# Patient Record
Sex: Female | Born: 1980 | Race: White | Hispanic: No | Marital: Single | Smoking: Former smoker
Health system: Southern US, Community
[De-identification: ages and names within clinical notes are randomized; demographics above are authoritative.]

## PROBLEM LIST (undated history)

## (undated) DIAGNOSIS — Z973 Presence of spectacles and contact lenses: Secondary | ICD-10-CM

## (undated) DIAGNOSIS — J45909 Unspecified asthma, uncomplicated: Secondary | ICD-10-CM

## (undated) DIAGNOSIS — M25531 Pain in right wrist: Secondary | ICD-10-CM

## (undated) DIAGNOSIS — M67431 Ganglion, right wrist: Secondary | ICD-10-CM

---

## 2001-02-08 ENCOUNTER — Encounter: Payer: Self-pay | Admitting: *Deleted

## 2001-02-08 ENCOUNTER — Inpatient Hospital Stay (HOSPITAL_COMMUNITY): Admission: AD | Admit: 2001-02-08 | Discharge: 2001-02-08 | Payer: Self-pay | Admitting: Obstetrics

## 2001-02-09 ENCOUNTER — Inpatient Hospital Stay (HOSPITAL_COMMUNITY): Admission: AD | Admit: 2001-02-09 | Discharge: 2001-02-09 | Payer: Self-pay | Admitting: Obstetrics & Gynecology

## 2002-08-25 ENCOUNTER — Emergency Department (HOSPITAL_COMMUNITY): Admission: EM | Admit: 2002-08-25 | Discharge: 2002-08-25 | Payer: Self-pay | Admitting: *Deleted

## 2003-01-03 ENCOUNTER — Inpatient Hospital Stay (HOSPITAL_COMMUNITY): Admission: AD | Admit: 2003-01-03 | Discharge: 2003-01-03 | Payer: Self-pay | Admitting: *Deleted

## 2003-01-03 ENCOUNTER — Encounter: Payer: Self-pay | Admitting: Family Medicine

## 2003-03-12 ENCOUNTER — Emergency Department (HOSPITAL_COMMUNITY): Admission: EM | Admit: 2003-03-12 | Discharge: 2003-03-12 | Payer: Self-pay | Admitting: Emergency Medicine

## 2003-06-01 ENCOUNTER — Emergency Department (HOSPITAL_COMMUNITY): Admission: EM | Admit: 2003-06-01 | Discharge: 2003-06-01 | Payer: Self-pay | Admitting: Emergency Medicine

## 2003-12-27 ENCOUNTER — Emergency Department (HOSPITAL_COMMUNITY): Admission: EM | Admit: 2003-12-27 | Discharge: 2003-12-27 | Payer: Self-pay | Admitting: Emergency Medicine

## 2004-02-24 ENCOUNTER — Emergency Department (HOSPITAL_COMMUNITY): Admission: EM | Admit: 2004-02-24 | Discharge: 2004-02-24 | Payer: Self-pay | Admitting: Family Medicine

## 2004-06-17 ENCOUNTER — Inpatient Hospital Stay (HOSPITAL_COMMUNITY): Admission: AD | Admit: 2004-06-17 | Discharge: 2004-06-18 | Payer: Self-pay | Admitting: Obstetrics and Gynecology

## 2004-08-21 ENCOUNTER — Ambulatory Visit (HOSPITAL_COMMUNITY): Admission: RE | Admit: 2004-08-21 | Discharge: 2004-08-21 | Payer: Self-pay | Admitting: *Deleted

## 2004-10-06 ENCOUNTER — Inpatient Hospital Stay (HOSPITAL_COMMUNITY): Admission: AD | Admit: 2004-10-06 | Discharge: 2004-10-06 | Payer: Self-pay | Admitting: *Deleted

## 2004-10-09 ENCOUNTER — Ambulatory Visit: Payer: Self-pay | Admitting: Family Medicine

## 2004-10-09 ENCOUNTER — Observation Stay (HOSPITAL_COMMUNITY): Admission: AD | Admit: 2004-10-09 | Discharge: 2004-10-10 | Payer: Self-pay | Admitting: *Deleted

## 2004-12-07 ENCOUNTER — Inpatient Hospital Stay (HOSPITAL_COMMUNITY): Admission: AD | Admit: 2004-12-07 | Discharge: 2004-12-07 | Payer: Self-pay | Admitting: Family Medicine

## 2004-12-23 ENCOUNTER — Inpatient Hospital Stay (HOSPITAL_COMMUNITY): Admission: AD | Admit: 2004-12-23 | Discharge: 2004-12-23 | Payer: Self-pay | Admitting: Obstetrics and Gynecology

## 2005-01-04 ENCOUNTER — Inpatient Hospital Stay (HOSPITAL_COMMUNITY): Admission: AD | Admit: 2005-01-04 | Discharge: 2005-01-04 | Payer: Self-pay | Admitting: Obstetrics & Gynecology

## 2005-01-06 ENCOUNTER — Ambulatory Visit: Payer: Self-pay | Admitting: *Deleted

## 2005-01-06 ENCOUNTER — Inpatient Hospital Stay (HOSPITAL_COMMUNITY): Admission: AD | Admit: 2005-01-06 | Discharge: 2005-01-10 | Payer: Self-pay | Admitting: Obstetrics and Gynecology

## 2005-01-07 ENCOUNTER — Encounter (INDEPENDENT_AMBULATORY_CARE_PROVIDER_SITE_OTHER): Payer: Self-pay | Admitting: *Deleted

## 2006-03-19 ENCOUNTER — Emergency Department (HOSPITAL_COMMUNITY): Admission: EM | Admit: 2006-03-19 | Discharge: 2006-03-19 | Payer: Self-pay | Admitting: Emergency Medicine

## 2006-06-22 ENCOUNTER — Emergency Department (HOSPITAL_COMMUNITY): Admission: EM | Admit: 2006-06-22 | Discharge: 2006-06-22 | Payer: Self-pay | Admitting: Emergency Medicine

## 2006-11-25 ENCOUNTER — Ambulatory Visit: Payer: Self-pay | Admitting: Obstetrics & Gynecology

## 2006-11-25 ENCOUNTER — Inpatient Hospital Stay (HOSPITAL_COMMUNITY): Admission: AD | Admit: 2006-11-25 | Discharge: 2006-11-25 | Payer: Self-pay | Admitting: Obstetrics & Gynecology

## 2006-11-25 ENCOUNTER — Emergency Department (HOSPITAL_COMMUNITY): Admission: EM | Admit: 2006-11-25 | Discharge: 2006-11-25 | Payer: Self-pay | Admitting: Family Medicine

## 2006-11-25 ENCOUNTER — Ambulatory Visit (HOSPITAL_COMMUNITY): Admission: RE | Admit: 2006-11-25 | Discharge: 2006-11-25 | Payer: Self-pay | Admitting: Family Medicine

## 2006-11-27 ENCOUNTER — Inpatient Hospital Stay (HOSPITAL_COMMUNITY): Admission: AD | Admit: 2006-11-27 | Discharge: 2006-11-27 | Payer: Self-pay | Admitting: Gynecology

## 2006-12-09 ENCOUNTER — Ambulatory Visit: Payer: Self-pay | Admitting: Obstetrics & Gynecology

## 2006-12-23 ENCOUNTER — Ambulatory Visit: Payer: Self-pay | Admitting: *Deleted

## 2007-03-10 ENCOUNTER — Emergency Department (HOSPITAL_COMMUNITY): Admission: EM | Admit: 2007-03-10 | Discharge: 2007-03-10 | Payer: Self-pay | Admitting: Emergency Medicine

## 2008-02-05 ENCOUNTER — Emergency Department (HOSPITAL_COMMUNITY): Admission: EM | Admit: 2008-02-05 | Discharge: 2008-02-05 | Payer: Self-pay | Admitting: Emergency Medicine

## 2008-03-12 ENCOUNTER — Emergency Department (HOSPITAL_COMMUNITY): Admission: EM | Admit: 2008-03-12 | Discharge: 2008-03-12 | Payer: Self-pay | Admitting: Family Medicine

## 2008-05-16 ENCOUNTER — Emergency Department (HOSPITAL_COMMUNITY): Admission: EM | Admit: 2008-05-16 | Discharge: 2008-05-16 | Payer: Self-pay | Admitting: Emergency Medicine

## 2008-11-14 ENCOUNTER — Ambulatory Visit: Payer: Self-pay | Admitting: Family Medicine

## 2008-11-14 ENCOUNTER — Encounter (INDEPENDENT_AMBULATORY_CARE_PROVIDER_SITE_OTHER): Payer: Self-pay | Admitting: Family Medicine

## 2008-12-27 ENCOUNTER — Emergency Department (HOSPITAL_COMMUNITY): Admission: EM | Admit: 2008-12-27 | Discharge: 2008-12-27 | Payer: Self-pay | Admitting: Emergency Medicine

## 2009-03-20 ENCOUNTER — Ambulatory Visit: Payer: Self-pay | Admitting: Obstetrics and Gynecology

## 2009-03-20 LAB — CONVERTED CEMR LAB: Yeast Wet Prep HPF POC: NONE SEEN

## 2009-03-26 ENCOUNTER — Ambulatory Visit: Payer: Self-pay | Admitting: Advanced Practice Midwife

## 2009-03-26 ENCOUNTER — Inpatient Hospital Stay (HOSPITAL_COMMUNITY): Admission: AD | Admit: 2009-03-26 | Discharge: 2009-03-26 | Payer: Self-pay | Admitting: Obstetrics & Gynecology

## 2009-05-28 ENCOUNTER — Ambulatory Visit (HOSPITAL_COMMUNITY): Admission: RE | Admit: 2009-05-28 | Discharge: 2009-05-28 | Payer: Self-pay | Admitting: Obstetrics

## 2009-08-31 ENCOUNTER — Inpatient Hospital Stay (HOSPITAL_COMMUNITY): Admission: AD | Admit: 2009-08-31 | Discharge: 2009-09-01 | Payer: Self-pay | Admitting: Obstetrics

## 2009-10-08 ENCOUNTER — Inpatient Hospital Stay (HOSPITAL_COMMUNITY): Admission: RE | Admit: 2009-10-08 | Discharge: 2009-10-10 | Payer: Self-pay | Admitting: Obstetrics

## 2011-01-18 LAB — CBC
Hemoglobin: 10.3 g/dL — ABNORMAL LOW (ref 12.0–15.0)
Hemoglobin: 7.3 g/dL — ABNORMAL LOW (ref 12.0–15.0)
RBC: 2.6 MIL/uL — ABNORMAL LOW (ref 3.87–5.11)
RBC: 3.67 MIL/uL — ABNORMAL LOW (ref 3.87–5.11)
RDW: 14.3 % (ref 11.5–15.5)
RDW: 14.6 % (ref 11.5–15.5)
WBC: 8.1 10*3/uL (ref 4.0–10.5)

## 2011-01-20 LAB — COMPREHENSIVE METABOLIC PANEL
ALT: 18 U/L (ref 0–35)
AST: 20 U/L (ref 0–37)
Alkaline Phosphatase: 133 U/L — ABNORMAL HIGH (ref 39–117)
CO2: 23 mEq/L (ref 19–32)
Calcium: 8.4 mg/dL (ref 8.4–10.5)
Chloride: 107 mEq/L (ref 96–112)
GFR calc non Af Amer: >60 mL/min (ref >60)
Glucose, Bld: 94 mg/dL (ref 70–99)
Potassium: 3.4 mEq/L — ABNORMAL LOW (ref 3.5–5.1)
Sodium: 136 mEq/L (ref 135–145)

## 2011-01-20 LAB — CBC
HCT: 30.2 % — ABNORMAL LOW (ref 36.0–46.0)
Hemoglobin: 10 g/dL — ABNORMAL LOW (ref 12.0–15.0)
MCHC: 33 g/dL (ref 30.0–36.0)
MCV: 85.1 fL (ref 78.0–100.0)
RBC: 3.55 MIL/uL — ABNORMAL LOW (ref 3.87–5.11)
WBC: 10.3 10*3/uL (ref 4.0–10.5)

## 2011-01-25 LAB — URINALYSIS, ROUTINE W REFLEX MICROSCOPIC
Bilirubin Urine: NEGATIVE
Glucose, UA: NEGATIVE mg/dL
Hgb urine dipstick: NEGATIVE
Ketones, ur: NEGATIVE mg/dL

## 2011-01-25 LAB — WET PREP, GENITAL

## 2011-01-25 LAB — POCT PREGNANCY, URINE: Preg Test, Ur: POSITIVE

## 2011-01-25 LAB — GC/CHLAMYDIA PROBE AMP, GENITAL: GC Probe Amp, Genital: NEGATIVE

## 2011-01-28 LAB — COMPREHENSIVE METABOLIC PANEL
BUN: 21 mg/dL (ref 6–23)
CO2: 24 mEq/L (ref 19–32)
Chloride: 107 mEq/L (ref 96–112)
Creatinine, Ser: 0.7 mg/dL (ref 0.4–1.2)
GFR calc non Af Amer: >60 mL/min (ref >60)
Glucose, Bld: 132 mg/dL — ABNORMAL HIGH (ref 70–99)
Total Bilirubin: 0.8 mg/dL (ref 0.3–1.2)

## 2011-01-28 LAB — URINALYSIS, ROUTINE W REFLEX MICROSCOPIC
Bilirubin Urine: NEGATIVE
Ketones, ur: 15 mg/dL — AB
Nitrite: NEGATIVE
Protein, ur: NEGATIVE mg/dL
Specific Gravity, Urine: 1.027 (ref 1.005–1.030)
Urobilinogen, UA: 0.2 mg/dL (ref 0.0–1.0)

## 2011-01-28 LAB — GC/CHLAMYDIA PROBE AMP, GENITAL
Chlamydia, DNA Probe: NEGATIVE
GC Probe Amp, Genital: NEGATIVE

## 2011-01-28 LAB — CBC
HCT: 43.4 % (ref 36.0–46.0)
Hemoglobin: 14.5 g/dL (ref 12.0–15.0)
Platelets: 259 10*3/uL (ref 150–400)
RBC: 5.16 MIL/uL — ABNORMAL HIGH (ref 3.87–5.11)
RDW: 13.6 % (ref 11.5–15.5)

## 2011-01-28 LAB — WET PREP, GENITAL
Trich, Wet Prep: NONE SEEN
Yeast Wet Prep HPF POC: NONE SEEN

## 2011-01-28 LAB — DIFFERENTIAL
Basophils Relative: 0 % (ref 0–1)
Monocytes Relative: 4 % (ref 3–12)
Neutro Abs: 10.3 10*3/uL — ABNORMAL HIGH (ref 1.7–7.7)
Neutrophils Relative %: 94 % — ABNORMAL HIGH (ref 43–77)

## 2011-01-28 LAB — PREGNANCY, URINE: Preg Test, Ur: NEGATIVE

## 2011-01-28 LAB — URINE CULTURE

## 2011-01-28 LAB — LIPASE, BLOOD: Lipase: 15 U/L (ref 11–59)

## 2011-03-05 NOTE — Discharge Summary (Signed)
[name], Alice Wagner            ACCOUNT NO.:  [id number]   MEDICAL RECORD NO.:  [id number]          PATIENT TYPE:  INP   LOCATION:  9155                           FACILITY:   PHYSICIAN:  Conni Elliot, M.D.DATE OF BIRTH:  1980-10-23   DATE OF ADMISSION:  10/09/2004  DATE OF DISCHARGE:  10/10/2004                                 DISCHARGE SUMMARY   ADMISSION DIAGNOSES:  96.  A 30 year old, gravida 1 at 28-2/7 weeks with vomiting and diarrhea.  2.  Positive ketonuria.   DISCHARGE DIAGNOSES:  15.  A 30 year old, gravid 1 at 28-3/7 weeks with viral gastroenteritis and      resolved diarrhea and vomiting.  2.  Reassuring fetal heart tracing.   ADMISSION HISTORY:  [name] was admitted with suspected viral  gastroenteritis.  She did have ketones in her urine and a specific gravity  greater than 1.030.  She was admitted for IV fluid hydration.   HOSPITAL COURSE:  On hospital day #2, the patient had had no further  vomiting or diarrhea.  She was keeping down light meals.   CONDITION ON DISCHARGE:  The patient was discharged to home in stable  condition.   INSTRUCTIONS GIVEN TO THE PATIENT:  The patient was told to continue  prenatal vitamins and follow up with [facility] at her next scheduled  appointment.      LC/MEDQ  D:  10/10/2004  T:  10/11/2004  Job:  [id number]   cc:   [facility]

## 2011-03-05 NOTE — Op Note (Signed)
Alice Wagner, Alice Wagner            ACCOUNT NO.:  [id number]   MEDICAL RECORD NO.:  [id number]          PATIENT TYPE:  INP   LOCATION:  9105                          FACILITY:  [facility]   PHYSICIAN:  Conni Elliot, M.D.DATE OF BIRTH:  24-Sep-1981   DATE OF PROCEDURE:  01/07/2005  DATE OF DISCHARGE:  01/10/2005                                 OPERATIVE REPORT   PREOPERATIVE DIAGNOSES:  1.  Arrest of descent.  2.  Cephalopelvic disproportion.   POSTOPERATIVE DIAGNOSES:  1.  Arrest of descent.  2.  Cephalopelvic disproportion.   OPERATION:  1.  Trial of forceps.  2.  Low transverse cesarean delivery.   OPERATORS:  Conni Elliot, M.D., and Marius Ditch, M.D.   OPERATIVE FINDINGS:  A trial of vacuum extraction was attempted with no  descent of fetal head.  The fetus was in an LOP position at +2 to +3  station.  The vaginal assisted delivery was attempted due to maternal  exhaustion and fetal tachycardia.  With the failure of descent, we proceeded  with taking the patient to the operating room for a low transverse cesarean  delivery.   The cesarean delivery was done under continuous lumbar epidural anesthetic.  The patient was supine in left lateral position, receiving oxygen and was  prepped and draped in a sterile fashion.  A low transverse Pfannenstiel  incision was made, incision made through the skin and subcutaneous tissue,  fascia, rectus muscles separated in the midline, the peritoneal cavity and  bladder flap created.  A low transverse uterine incision was made.  The baby  was delivered from a vertex presentation, the cord doubly clamped and cut,  handed to the neonatologist in attendance.  The placenta was delivered  spontaneously.  The uterus, bladder flap, anterior peritoneum, fascia,  subcutaneous tissue and skin were closed in the usual fashion.  Estimated  blood loss was approximately 900 mL without replacement  Needle and sponge  count was correct.      ASG/MEDQ  D:  02/11/2005  T:  02/11/2005  Job:  [id number]

## 2011-03-05 NOTE — Discharge Summary (Signed)
NAMESHEENAH, DIMITROFF            ACCOUNT NO.:  [id number]   MEDICAL RECORD NO.:  [id number]          PATIENT TYPE:  INP   LOCATION:  9105                          FACILITY:  [facility]   PHYSICIAN:  Phil D. Okey Dupre, M.D.     DATE OF BIRTH:  Oct 24, 1980   DATE OF ADMISSION:  01/06/2005  DATE OF DISCHARGE:  01/10/2005                                 DISCHARGE SUMMARY   ADMISSION DIAGNOSES:  1.  Intrauterine pregnancy at 40 weeks and 6 days.  2.  Admitted for induction of labor.   DISCHARGE DIAGNOSES:  1.  Status post low transverse cesarean section.  2.  Failure to progress.  3.  Cephalopelvic disproportion.   DISCHARGE MEDICATIONS:  1.  Percocet 5/325 one to two tablets p.o. q.6h as needed for pain.  2.  Motrin 600 mg one pill every 6 hours as needed for pain.  3.  Depo-Provera given on January 09, 2005, with next injection in 12 weeks.  4.  Colace 100 mg one p.o. daily.  5.  Prenatal vitamins one per day x6 weeks.  6.  Ferrous sulfate 325 mg one p.o. b.i.d.   HISTORY OF PRESENT ILLNESS:  The patient is a 30 year old gravida 1, at 40  weeks and 6 days who presented with contractions every 2 to 3 minutes.  On  presentation, membranes were intact.  No vaginal bleeding.  Positive fetal  activity.  The patient's cervical examination was noted to be fingertip,  thick, and -3.  Baseline fetal heart rate in the 140's with 10 x 10  accelerations, positive variability, and no decelerations.  Initial  ultrasound showed a BPP of 8 out of 8 with an AFI of 9.8.  The patient was  admitted for induction with postdates.   HOSPITAL COURSE:  The patient was admitted to labor and delivery and at  approximately 0600 hours on January 07, 2005, the patient SROM'd with noted  meconium.  The patient was augmented with Pitocin.  Amnioinfusion was  started.  The patient continued to labor and by approximately 1200 hours,  the patient had a sterile vaginal examination of 10, 100, and +1 with noted  caput.  The  patient was given opportunity to labor down.  Vacuum assisted  vaginal delivery was discussed with the patient and with the attending.  Failed vacuum assisted vaginal delivery occurred at 1440 on January 07, 2005.  The patient was taken to the operating room for a cesarean section.  The  patient delivered at 1501 hours with epidural anesthesia by a low transverse  cesarean section for failure to progress and cephalopelvic disproportion  with Apgars of 8 at one minute and 9 at five minutes.  The placenta  delivered spontaneously.  The patient was covered with cefoxitin 1 gram IV  q.6h for 24 hours postoperatively.   Otherwise, the patient's recovery was unremarkable.  She was afebrile for 24  hours.  Antibiotics were discontinued.  Her bleeding decreased.  Her pain  was controlled.  Her hemoglobin did drop to 8.2, but she was asymptomatic  and  no symptomatically orthostatic and she was started on  iron as above.  The  incision was healing well and she was getting Depo-Provera for contraception  and she was deemed stable for discharge.   DISCHARGE INSTRUCTIONS:  Per routine.      GSD/MEDQ  D:  01/10/2005  T:  01/11/2005  Job:  [id number]

## 2011-07-14 LAB — CULTURE, ROUTINE-ABSCESS: Gram Stain: NONE SEEN

## 2011-07-14 LAB — POCT RAPID STREP A: Streptococcus, Group A Screen (Direct): POSITIVE — AB

## 2011-07-16 LAB — POCT I-STAT, CHEM 8
BUN: 20
Chloride: 106
Creatinine, Ser: 1
Potassium: 5.3 — ABNORMAL HIGH
Sodium: 139

## 2011-07-16 LAB — URINE CULTURE: Colony Count: >=100000

## 2011-07-16 LAB — CBC
MCHC: 33.1
Platelets: 247
RBC: 4.39
WBC: 8.8

## 2011-07-16 LAB — DIFFERENTIAL
Eosinophils Absolute: 0.1
Eosinophils Relative: 1
Lymphocytes Relative: 17
Lymphs Abs: 1.5
Monocytes Absolute: 0.7
Monocytes Relative: 8

## 2011-07-16 LAB — URINALYSIS, ROUTINE W REFLEX MICROSCOPIC
Bilirubin Urine: NEGATIVE
Glucose, UA: NEGATIVE
Ketones, ur: 40 — AB
pH: 6

## 2011-07-16 LAB — COMPREHENSIVE METABOLIC PANEL
ALT: 8
AST: 10
Albumin: 3.8
CO2: 26
Calcium: 9.2
GFR calc Af Amer: >60
GFR calc non Af Amer: >60
Sodium: 138

## 2011-07-16 LAB — URINE MICROSCOPIC-ADD ON

## 2011-07-16 LAB — WET PREP, GENITAL
Trich, Wet Prep: NONE SEEN
Yeast Wet Prep HPF POC: NONE SEEN

## 2011-07-16 LAB — RPR: RPR Ser Ql: NONREACTIVE

## 2011-07-16 LAB — LIPASE, BLOOD: Lipase: 14

## 2015-03-19 ENCOUNTER — Ambulatory Visit (HOSPITAL_COMMUNITY)
Admission: RE | Admit: 2015-03-19 | Discharge: 2015-03-19 | Disposition: A | Discharge status: Home / Self Care | Payer: Medicaid Other | Source: Ambulatory Visit | Attending: Family Medicine | Admitting: Family Medicine

## 2015-03-19 ENCOUNTER — Encounter (HOSPITAL_COMMUNITY): Payer: Self-pay | Admitting: *Deleted

## 2015-03-19 ENCOUNTER — Emergency Department (HOSPITAL_COMMUNITY)
Admission: EM | Admit: 2015-03-19 | Discharge: 2015-03-19 | Disposition: A | Discharge status: Home / Self Care | Payer: Medicaid Other | Source: Home / Self Care | Attending: Family Medicine | Admitting: Family Medicine

## 2015-03-19 DIAGNOSIS — M79674 Pain in right toe(s): Secondary | ICD-10-CM

## 2015-03-19 NOTE — Discharge Instructions (Signed)
Thank you for coming in today. Follow up with orthopedics.  Return as needed.

## 2015-03-19 NOTE — ED Provider Notes (Addendum)
Jackquline BerlinShytonya Willeen CassBennett is a 34 y.o. female who presents to Urgent Care today for toe pain. Patient injured her right fifth toe a few months ago. She stubbed the toe. She was doing okay until 6 days ago when she was stepped on by 33-year-old. She notes toe pain and swelling extending to her lateral foot. The pain is severe with palpation. No radiating pain weakness or numbness fevers or chills. She feels well otherwise. No treatment tried yet.   History reviewed. No pertinent past medical history. History reviewed. No pertinent past surgical history. History  Substance Use Topics  . Smoking status: Never Smoker   . Smokeless tobacco: Not on file  . Alcohol Use: No   ROS as above Medications: No current facility-administered medications for this encounter.   No current outpatient prescriptions on file.   No Known Allergies   Exam:  BP 113/73 mmHg  Pulse 62  Temp(Src) 98 F (36.7 C) (Oral)  Resp 12  SpO2 100%  LMP 03/19/2015 (Exact Date) Gen: Well NAD HEENT: EOMI,  MMM Lungs: Normal work of breathing. CTABL Heart: RRR no MRG Abd: NABS, Soft. Nondistended, Nontender Exts: Brisk capillary refill, warm and well perfused.  Right leg knee nontender normal motion Ankle normal-appearing nontender. Foot swollen lateral foot. Tender palpation fifth toe. Nontender fifth metatarsal. Capillary refill and pulses intact. Skin is normal-appearing no induration.  No results found for this or any previous visit (from the past 24 hour(s)). Dg Foot Complete Right  03/19/2015   CLINICAL DATA:  Right foot injury, pain to 5th toe  EXAM: RIGHT FOOT COMPLETE - 3+ VIEW  COMPARISON:  None.  FINDINGS: No fracture or dislocation is seen.  The joint spaces are preserved.  The visualized soft tissues are unremarkable.  IMPRESSION: No fracture or dislocation is seen.   Electronically Signed   By: Charline BillsSriyesh  Krishnan M.D.   On: 03/19/2015 11:02    Assessment and Plan: 34 y.o. female with foot pain. Patient is quite  tender at her right fifth toe. Unclear etiology. Follow-up with orthopedics. NSAIDs for pain as needed.  Discussed warning signs or symptoms. Please see discharge instructions. Patient expresses understanding.     Rodolph BongEvan S Chardae Mulkern, MD 03/19/15 1145  Rodolph BongEvan S Keeanna Villafranca, MD 03/19/15 1145

## 2015-03-19 NOTE — ED Notes (Signed)
Pt  Reports     She  Injured  Her  r   Small toe       sev  Months   Ago        She        Reports      She  reinjured the  Toe      6  Days  Ago   When   Someone  Stepped  On it     she  Has  Pain and   Swelling of the  Affected  Toe/foot

## 2015-05-15 ENCOUNTER — Ambulatory Visit (HOSPITAL_COMMUNITY)
Admission: RE | Admit: 2015-05-15 | Discharge: 2015-05-15 | Disposition: A | Discharge status: Home / Self Care | Payer: Medicaid Other | Source: Ambulatory Visit | Attending: Primary Care | Admitting: Primary Care

## 2015-05-15 ENCOUNTER — Other Ambulatory Visit (HOSPITAL_COMMUNITY): Payer: Self-pay | Admitting: Primary Care

## 2015-05-15 DIAGNOSIS — Z01818 Encounter for other preprocedural examination: Secondary | ICD-10-CM | POA: Insufficient documentation

## 2015-05-23 ENCOUNTER — Encounter (HOSPITAL_BASED_OUTPATIENT_CLINIC_OR_DEPARTMENT_OTHER): Payer: Self-pay | Admitting: *Deleted

## 2015-05-23 NOTE — Progress Notes (Signed)
NPO AFTER MN WITH EXCEPTION CLEAR LIQUIDS UNTIL 0700 (NO CREAM /MILK PRODUCTS).  ARRIVE AT 1130. NEEDS HG AND URINE PREG.  

## 2015-05-29 ENCOUNTER — Ambulatory Visit (HOSPITAL_BASED_OUTPATIENT_CLINIC_OR_DEPARTMENT_OTHER): Payer: Medicaid Other | Admitting: Anesthesiology

## 2015-05-29 ENCOUNTER — Encounter (HOSPITAL_BASED_OUTPATIENT_CLINIC_OR_DEPARTMENT_OTHER): Payer: Self-pay | Admitting: Anesthesiology

## 2015-05-29 ENCOUNTER — Encounter (HOSPITAL_BASED_OUTPATIENT_CLINIC_OR_DEPARTMENT_OTHER)
Admission: RE | Disposition: A | Discharge status: Home / Self Care | Payer: Self-pay | Source: Ambulatory Visit | Attending: Orthopedic Surgery

## 2015-05-29 ENCOUNTER — Ambulatory Visit (HOSPITAL_BASED_OUTPATIENT_CLINIC_OR_DEPARTMENT_OTHER)
Admission: RE | Admit: 2015-05-29 | Discharge: 2015-05-29 | Disposition: A | Discharge status: Home / Self Care | Payer: Medicaid Other | Source: Ambulatory Visit | Attending: Orthopedic Surgery | Admitting: Orthopedic Surgery

## 2015-05-29 DIAGNOSIS — M67431 Ganglion, right wrist: Secondary | ICD-10-CM | POA: Insufficient documentation

## 2015-05-29 DIAGNOSIS — Z91048 Other nonmedicinal substance allergy status: Secondary | ICD-10-CM | POA: Diagnosis not present

## 2015-05-29 DIAGNOSIS — Z87891 Personal history of nicotine dependence: Secondary | ICD-10-CM | POA: Insufficient documentation

## 2015-05-29 DIAGNOSIS — M24131 Other articular cartilage disorders, right wrist: Secondary | ICD-10-CM | POA: Diagnosis not present

## 2015-05-29 HISTORY — DX: Presence of spectacles and contact lenses: Z97.3

## 2015-05-29 HISTORY — DX: Pain in right wrist: M25.531

## 2015-05-29 HISTORY — PX: WRIST ARTHROSCOPY: SHX838

## 2015-05-29 HISTORY — DX: Ganglion, right wrist: M67.431

## 2015-05-29 LAB — POCT HEMOGLOBIN-HEMACUE: Hemoglobin: 12.2 g/dL (ref 12.0–15.0)

## 2015-05-29 LAB — POCT PREGNANCY, URINE: PREG TEST UR: NEGATIVE

## 2015-05-29 SURGERY — ARTHROSCOPY, WRIST
Anesthesia: General | Site: Wrist | Laterality: Right

## 2015-05-29 MED ORDER — LACTATED RINGERS IV SOLN
INTRAVENOUS | Status: DC
  Filled 2015-05-29: qty 1000

## 2015-05-29 MED ORDER — HYDROCODONE-ACETAMINOPHEN 5-300 MG PO TABS: 1.0000 | ORAL_TABLET | Freq: Four times a day (QID) | ORAL | Status: DC | PRN

## 2015-05-29 MED ORDER — PROMETHAZINE HCL 25 MG/ML IJ SOLN
INTRAMUSCULAR | Status: AC
  Filled 2015-05-29: qty 1

## 2015-05-29 MED ORDER — PROMETHAZINE HCL 25 MG/ML IJ SOLN
12.5000 mg | Freq: Four times a day (QID) | INTRAMUSCULAR | Status: DC | PRN
  Administered 2015-05-29: 6.25 mg via INTRAVENOUS
  Filled 2015-05-29: qty 1

## 2015-05-29 MED ORDER — CEFAZOLIN SODIUM-DEXTROSE 2-3 GM-% IV SOLR
INTRAVENOUS | Status: AC
  Filled 2015-05-29: qty 50

## 2015-05-29 MED ORDER — ACETAMINOPHEN 10 MG/ML IV SOLN
INTRAVENOUS | Status: DC | PRN
  Administered 2015-05-29: 1000 mg via INTRAVENOUS

## 2015-05-29 MED ORDER — MIDAZOLAM HCL 5 MG/5ML IJ SOLN
INTRAMUSCULAR | Status: DC | PRN
  Administered 2015-05-29: 2 mg via INTRAVENOUS

## 2015-05-29 MED ORDER — FENTANYL CITRATE (PF) 100 MCG/2ML IJ SOLN
INTRAMUSCULAR | Status: AC
Start: 2015-05-29 — End: 2015-05-29
  Filled 2015-05-29: qty 2

## 2015-05-29 MED ORDER — SODIUM CHLORIDE 0.9 % IR SOLN
Status: DC | PRN
  Administered 2015-05-29: 1000 mL

## 2015-05-29 MED ORDER — OXYCODONE HCL 5 MG PO TABS
ORAL_TABLET | ORAL | Status: AC
  Filled 2015-05-29: qty 1

## 2015-05-29 MED ORDER — GLYCOPYRROLATE 0.2 MG/ML IJ SOLN
INTRAMUSCULAR | Status: DC | PRN
  Administered 2015-05-29: 0.2 mg via INTRAVENOUS

## 2015-05-29 MED ORDER — ONDANSETRON HCL 4 MG/2ML IJ SOLN
INTRAMUSCULAR | Status: DC | PRN
  Administered 2015-05-29: 4 mg via INTRAVENOUS

## 2015-05-29 MED ORDER — OXYCODONE HCL 5 MG PO TABS
5.0000 mg | ORAL_TABLET | Freq: Once | ORAL | Status: AC
  Administered 2015-05-29: 5 mg via ORAL
  Filled 2015-05-29: qty 1

## 2015-05-29 MED ORDER — LIDOCAINE HCL (CARDIAC) 20 MG/ML IV SOLN
INTRAVENOUS | Status: DC | PRN
  Administered 2015-05-29: 100 mg via INTRAVENOUS

## 2015-05-29 MED ORDER — KETOROLAC TROMETHAMINE 30 MG/ML IJ SOLN
INTRAMUSCULAR | Status: DC | PRN
  Administered 2015-05-29: 30 mg via INTRAVENOUS

## 2015-05-29 MED ORDER — FENTANYL CITRATE (PF) 100 MCG/2ML IJ SOLN
25.0000 ug | INTRAMUSCULAR | Status: DC | PRN
  Administered 2015-05-29: 25 ug via INTRAVENOUS
  Administered 2015-05-29: 50 ug via INTRAVENOUS
  Filled 2015-05-29: qty 1

## 2015-05-29 MED ORDER — FENTANYL CITRATE (PF) 100 MCG/2ML IJ SOLN
INTRAMUSCULAR | Status: AC
  Filled 2015-05-29: qty 4

## 2015-05-29 MED ORDER — DEXAMETHASONE SODIUM PHOSPHATE 4 MG/ML IJ SOLN
INTRAMUSCULAR | Status: DC | PRN
Start: 2015-05-29 — End: 2015-05-29
  Administered 2015-05-29: 10 mg via INTRAVENOUS

## 2015-05-29 MED ORDER — CHLORHEXIDINE GLUCONATE 4 % EX LIQD
60.0000 mL | Freq: Once | CUTANEOUS | Status: DC
  Filled 2015-05-29: qty 60

## 2015-05-29 MED ORDER — CEFAZOLIN SODIUM-DEXTROSE 2-3 GM-% IV SOLR
2.0000 g | INTRAVENOUS | Status: AC
  Administered 2015-05-29: 2 g via INTRAVENOUS
  Filled 2015-05-29: qty 50

## 2015-05-29 MED ORDER — 0.9 % SODIUM CHLORIDE (POUR BTL) OPTIME
TOPICAL | Status: DC | PRN
  Administered 2015-05-29: 500 mL

## 2015-05-29 MED ORDER — LACTATED RINGERS IV SOLN
INTRAVENOUS | Status: DC
Start: 2015-05-29 — End: 2015-05-29
  Administered 2015-05-29 (×2): via INTRAVENOUS
  Filled 2015-05-29: qty 1000

## 2015-05-29 MED ORDER — PROPOFOL 10 MG/ML IV BOLUS
INTRAVENOUS | Status: DC | PRN
  Administered 2015-05-29: 200 mg via INTRAVENOUS

## 2015-05-29 MED ORDER — DOCUSATE SODIUM 100 MG PO CAPS: 100.0000 mg | ORAL_CAPSULE | Freq: Two times a day (BID) | ORAL | Status: DC

## 2015-05-29 MED ORDER — FENTANYL CITRATE (PF) 100 MCG/2ML IJ SOLN
INTRAMUSCULAR | Status: DC | PRN
  Administered 2015-05-29: 50 ug via INTRAVENOUS
  Administered 2015-05-29 (×3): 25 ug via INTRAVENOUS
  Administered 2015-05-29 (×3): 50 ug via INTRAVENOUS
  Administered 2015-05-29: 25 ug via INTRAVENOUS

## 2015-05-29 MED ORDER — BUPIVACAINE HCL 0.25 % IJ SOLN
INTRAMUSCULAR | Status: DC | PRN
  Administered 2015-05-29: 5 mL

## 2015-05-29 MED ORDER — MIDAZOLAM HCL 2 MG/2ML IJ SOLN
INTRAMUSCULAR | Status: AC
  Filled 2015-05-29: qty 2

## 2015-05-29 SURGICAL SUPPLY — 78 items
BANDAGE ELASTIC 3 VELCRO ST LF (GAUZE/BANDAGES/DRESSINGS) ×4 IMPLANT
BANDAGE ELASTIC 4 VELCRO ST LF (GAUZE/BANDAGES/DRESSINGS) IMPLANT
BLADE CUDA 2.0 (BLADE) ×2 IMPLANT
BLADE SURG 11 STRL SS (BLADE) ×1 IMPLANT
BLADE SURG 15 STRL LF DISP TIS (BLADE) IMPLANT
BLADE SURG 15 STRL SS (BLADE) ×3
BNDG CMPR 9X4 STRL LF SNTH (GAUZE/BANDAGES/DRESSINGS) ×1
BNDG COHESIVE 1X5 TAN STRL LF (GAUZE/BANDAGES/DRESSINGS) ×2 IMPLANT
BNDG COHESIVE 3X5 TAN STRL LF (GAUZE/BANDAGES/DRESSINGS) ×2 IMPLANT
BNDG CONFORM 3 STRL LF (GAUZE/BANDAGES/DRESSINGS) ×3 IMPLANT
BNDG ESMARK 4X9 LF (GAUZE/BANDAGES/DRESSINGS) ×3 IMPLANT
BNDG GAUZE ELAST 4 BULKY (GAUZE/BANDAGES/DRESSINGS) ×3 IMPLANT
CANISTER SUCT LVC 12 LTR MEDI- (MISCELLANEOUS) ×3 IMPLANT
CHLORAPREP W/TINT 26ML (MISCELLANEOUS) ×1 IMPLANT
CLOSURE WOUND 1/2 X4 (GAUZE/BANDAGES/DRESSINGS)
CORDS BIPOLAR (ELECTRODE) IMPLANT
COVER BACK TABLE 60X90IN (DRAPES) ×3 IMPLANT
COVER MAYO STAND STRL (DRAPES) ×3 IMPLANT
CUFF TOURNIQUET SINGLE 18IN (TOURNIQUET CUFF) IMPLANT
DRAPE EXTREMITY T 121X128X90 (DRAPE) ×3 IMPLANT
DRAPE OEC MINIVIEW 54X84 (DRAPES) IMPLANT
DRAPE SURG 17X23 STRL (DRAPES) ×3 IMPLANT
DRSG EMULSION OIL 3X3 NADH (GAUZE/BANDAGES/DRESSINGS) IMPLANT
DRSG KUZMA FLUFF (GAUZE/BANDAGES/DRESSINGS) ×2 IMPLANT
GAUZE SPONGE 4X4 16PLY XRAY LF (GAUZE/BANDAGES/DRESSINGS) IMPLANT
GAUZE XEROFORM 1X8 LF (GAUZE/BANDAGES/DRESSINGS) ×2 IMPLANT
GLOVE BIO SURGEON STRL SZ8 (GLOVE) ×3 IMPLANT
GLOVE BIOGEL PI IND STRL 7.5 (GLOVE) IMPLANT
GLOVE BIOGEL PI IND STRL 8.5 (GLOVE) ×1 IMPLANT
GLOVE BIOGEL PI INDICATOR 7.5 (GLOVE) ×2
GLOVE BIOGEL PI INDICATOR 8.5 (GLOVE) ×2
GOWN STRL REUS W/ TWL LRG LVL3 (GOWN DISPOSABLE) ×1 IMPLANT
GOWN STRL REUS W/TWL LRG LVL3 (GOWN DISPOSABLE) ×3
IV NS 1000ML (IV SOLUTION) ×6
IV NS 1000ML BAXH (IV SOLUTION) IMPLANT
NDL HYPO 25X1 1.5 SAFETY (NEEDLE) IMPLANT
NDL MAYO 6 CRC TAPER PT (NEEDLE) IMPLANT
NDL SUT 6 .5 CRC .975X.05 MAYO (NEEDLE) IMPLANT
NEEDLE HYPO 22GX1.5 SAFETY (NEEDLE) ×6 IMPLANT
NEEDLE HYPO 25X1 1.5 SAFETY (NEEDLE) ×3 IMPLANT
NEEDLE MAYO 6 CRC TAPER PT (NEEDLE) IMPLANT
NEEDLE MAYO TAPER (NEEDLE)
NS IRRIG 500ML POUR BTL (IV SOLUTION) ×2 IMPLANT
PACK BASIN DAY SURGERY FS (CUSTOM PROCEDURE TRAY) ×3 IMPLANT
PAD CAST 3X4 CTTN HI CHSV (CAST SUPPLIES) ×2 IMPLANT
PAD CAST 4YDX4 CTTN HI CHSV (CAST SUPPLIES) IMPLANT
PADDING CAST ABS 4INX4YD NS (CAST SUPPLIES) ×2
PADDING CAST ABS COTTON 4X4 ST (CAST SUPPLIES) ×1 IMPLANT
PADDING CAST COTTON 3X4 STRL (CAST SUPPLIES) ×6
PADDING CAST COTTON 4X4 STRL (CAST SUPPLIES)
PASSER SUT SWANSON 36MM LOOP (INSTRUMENTS) IMPLANT
SET SM JOINT TUBING/CANN (CANNULA) ×3 IMPLANT
SLING ARM MED ADULT FOAM STRAP (SOFTGOODS) IMPLANT
SPLINT FAST PLASTER 5X30 (CAST SUPPLIES)
SPLINT FIBERGLASS 3X35 (CAST SUPPLIES) IMPLANT
SPLINT FIBERGLASS 4X30 (CAST SUPPLIES) IMPLANT
SPLINT PLASTER CAST FAST 5X30 (CAST SUPPLIES) IMPLANT
SPLINT PLASTER CAST XFAST 3X15 (CAST SUPPLIES) IMPLANT
SPLINT PLASTER XTRA FASTSET 3X (CAST SUPPLIES) ×4
SPONGE GAUZE 4X4 12PLY STER LF (GAUZE/BANDAGES/DRESSINGS) ×2 IMPLANT
STOCKINETTE 4X48 STRL (DRAPES) ×3 IMPLANT
STOCKINETTE SYNTHETIC 3 UNSTER (CAST SUPPLIES) ×3 IMPLANT
STRIP CLOSURE SKIN 1/2X4 (GAUZE/BANDAGES/DRESSINGS) IMPLANT
SUT ETHIBOND 3-0 V-5 (SUTURE) IMPLANT
SUT PDS 2 CP NEEDLE XSPECIAL (SUTURE) IMPLANT
SUT PDS AB 2-0 CT2 27 (SUTURE) IMPLANT
SUT PROLENE 3 0 PS 2 (SUTURE) IMPLANT
SUT PROLENE 4 0 PS 2 18 (SUTURE) ×3 IMPLANT
SUT VIC AB 3-0 FS2 27 (SUTURE) IMPLANT
SYR BULB 3OZ (MISCELLANEOUS) IMPLANT
SYR CONTROL 10ML LL (SYRINGE) ×3 IMPLANT
TAPE SURG TRANSPORE 1 IN (GAUZE/BANDAGES/DRESSINGS) ×1 IMPLANT
TAPE SURGICAL TRANSPORE 1 IN (GAUZE/BANDAGES/DRESSINGS) ×2
TOWEL OR 17X24 6PK STRL BLUE (TOWEL DISPOSABLE) ×6 IMPLANT
TRAP DIGIT (INSTRUMENTS) ×4 IMPLANT
TUBE CONNECTING 12'X1/4 (SUCTIONS)
TUBE CONNECTING 12X1/4 (SUCTIONS) IMPLANT
UNDERPAD 30X30 INCONTINENT (UNDERPADS AND DIAPERS) ×3 IMPLANT

## 2015-05-29 NOTE — Anesthesia Procedure Notes (Signed)
Procedure Name: LMA Insertion Date/Time: 05/29/2015 1:23 PM Performed by: Norva Pavlov Pre-anesthesia Checklist: Patient identified, Emergency Drugs available, Suction available and Patient being monitored Patient Re-evaluated:Patient Re-evaluated prior to inductionOxygen Delivery Method: Circle System Utilized Preoxygenation: Pre-oxygenation with 100% oxygen Intubation Type: IV induction Ventilation: Mask ventilation without difficulty LMA: LMA inserted LMA Size: 4.0 Number of attempts: 1 Airway Equipment and Method: bite block Placement Confirmation: positive ETCO2 Tube secured with: Tape Dental Injury: Teeth and Oropharynx as per pre-operative assessment

## 2015-05-29 NOTE — Anesthesia Preprocedure Evaluation (Signed)
Anesthesia Evaluation  Patient identified by MRN, date of birth, ID band Patient awake    Reviewed: Allergy & Precautions, H&P , NPO status , Patient's Chart, lab work & pertinent test results  Airway Mallampati: II  TM Distance: >3 FB Neck ROM: full    Dental no notable dental hx. (+) Dental Advisory Given, Teeth Intact   Pulmonary neg pulmonary ROS, former smoker,  breath sounds clear to auscultation  Pulmonary exam normal       Cardiovascular Exercise Tolerance: Good negative cardio ROS Normal cardiovascular examRhythm:regular Rate:Normal     Neuro/Psych negative neurological ROS  negative psych ROS   GI/Hepatic negative GI ROS, Neg liver ROS,   Endo/Other  negative endocrine ROS  Renal/GU negative Renal ROS  negative genitourinary   Musculoskeletal   Abdominal   Peds  Hematology negative hematology ROS (+)   Anesthesia Other Findings   Reproductive/Obstetrics negative OB ROS                             Anesthesia Physical Anesthesia Plan  ASA: I  Anesthesia Plan: General   Post-op Pain Management:    Induction: Intravenous  Airway Management Planned: LMA  Additional Equipment:   Intra-op Plan:   Post-operative Plan:   Informed Consent: I have reviewed the patients History and Physical, chart, labs and discussed the procedure including the risks, benefits and alternatives for the proposed anesthesia with the patient or authorized representative who has indicated his/her understanding and acceptance.   Dental Advisory Given  Plan Discussed with: CRNA and Surgeon  Anesthesia Plan Comments:         Anesthesia Quick Evaluation

## 2015-05-29 NOTE — Brief Op Note (Signed)
05/29/2015  1:17 PM  PATIENT:  Alice Wagner  34 y.o. female  PRE-OPERATIVE DIAGNOSIS:  RIGHT WRIST TRIANGULAR FIBROCARTILAGE COMPLEX TEAR DORSAL GANGLION SCAPHOLUNATE LIGAMENT   POST-OPERATIVE DIAGNOSIS:  RIGHT WRIST TRIANGULAR FIBROCARTILAGE COMPLEX   PROCEDURE:  Procedure(s): RIGHT WRIST ARTHROSCOPY DEBRIDEMENT AND OR REPAIR AND GANGLION EXCISION  (Right)  SURGEON:  Surgeon(s) and Role:    * Bradly Bienenstock, MD - Primary  PHYSICIAN ASSISTANT:   ASSISTANTS: none   ANESTHESIA:   general  EBL:     BLOOD ADMINISTERED:none  DRAINS: none   LOCAL MEDICATIONS USED:  MARCAINE     SPECIMEN:  No Specimen  DISPOSITION OF SPECIMEN:  N/A  COUNTS:  YES  TOURNIQUET:    DICTATION: .161096  PLAN OF CARE: Discharge to home after PACU  PATIENT DISPOSITION:  PACU - hemodynamically stable.   Delay start of Pharmacological VTE agent (>24hrs) due to surgical blood loss or risk of bleeding: not applicable

## 2015-05-29 NOTE — Transfer of Care (Signed)
Last Vitals:  Filed Vitals:   05/29/15 1139  BP: 122/77  Pulse: 68  Temp: 37.1 C  Resp: 12   Immediate Anesthesia Transfer of Care Note  Patient: Alice Wagner  Procedure(s) Performed: Procedure(s) (LRB): RIGHT WRIST ARTHROSCOPY DEBRIDEMENT, ARTHROSCOPIC GANGLION CYST DECOMPRESSION (Right)  Patient Location: PACU  Anesthesia Type: General  Level of Consciousness: awake, alert  and oriented  Airway & Oxygen Therapy: Patient Spontanous Breathing and Patient connected to face mask oxygen  Post-op Assessment: Report given to PACU RN and Post -op Vital signs reviewed and stable  Post vital signs: Reviewed and stable  Complications: No apparent anesthesia complications

## 2015-05-29 NOTE — Anesthesia Postprocedure Evaluation (Signed)
  Anesthesia Post-op Note  Patient: Alice Wagner  Procedure(s) Performed: Procedure(s) (LRB): RIGHT WRIST ARTHROSCOPY DEBRIDEMENT, ARTHROSCOPIC GANGLION CYST DECOMPRESSION (Right)  Patient Location: PACU  Anesthesia Type: General  Level of Consciousness: awake and alert   Airway and Oxygen Therapy: Patient Spontanous Breathing  Post-op Pain: mild  Post-op Assessment: Post-op Vital signs reviewed, Patient's Cardiovascular Status Stable, Respiratory Function Stable, Patent Airway and No signs of Nausea or vomiting  Last Vitals:  Filed Vitals:   05/29/15 1530  BP: 124/85  Pulse: 76  Temp:   Resp: 11    Post-op Vital Signs: stable   Complications: No apparent anesthesia complications

## 2015-05-29 NOTE — H&P (Signed)
Alice Wagner is an 34 y.o. female.   Chief Complaint: right dorsal wrist pain HPI: Pt followed in office Pt with persistent dorsal right wrist pain Pt here for surgery on right wrist No prior surgery to right wrist  Past Medical History  Diagnosis Date  . Right wrist pain   . Ganglion of right wrist   . Wears glasses     Past Surgical History  Procedure Laterality Date  . Cesarean section  2012;  10-08-2009 ;  01-07-2005    History reviewed. No pertinent family history. Social History:  reports that she quit smoking about 2 years ago. Her smoking use included Cigarettes. She quit after 6 years of use. She has never used smokeless tobacco. She reports that she does not drink alcohol or use illicit drugs.  Allergies:  Allergies  Allergen Reactions  . Aloe Other (See Comments)    edema    Medications Prior to Admission  Medication Sig Dispense Refill  . ibuprofen (ADVIL,MOTRIN) 200 MG tablet Take 200 mg by mouth every 6 (six) hours as needed.      Results for orders placed or performed during the hospital encounter of 05/29/15 (from the past 48 hour(s))  Pregnancy, urine POC     Status: None   Collection Time: 05/29/15 12:06 PM  Result Value Ref Range   Preg Test, Ur NEGATIVE NEGATIVE    Comment:        THE SENSITIVITY OF THIS METHODOLOGY IS >24 mIU/mL   Hemoglobin-hemacue, POC     Status: None   Collection Time: 05/29/15 12:18 PM  Result Value Ref Range   Hemoglobin 12.2 12.0 - 15.0 g/dL   No results found.  ROSNO RECENT ILLNESSES OR HOSPITALIZATIONS  Blood pressure 122/77, pulse 68, temperature 98.7 F (37.1 C), temperature source Oral, resp. rate 12, height  (1.651 m), weight 68.493 kg (151 lb), last menstrual period 05/23/2015, SpO2 100 %. Physical Exam  General Appearance:  Alert, cooperative, no distress, appears stated age  Head:  Normocephalic, without obvious abnormality, atraumatic  Eyes:  Pupils equal, conjunctiva/corneas clear,          Throat: Lips, mucosa, and tongue normal; teeth and gums normal  Neck: No visible masses     Lungs:   respirations unlabored  Chest Wall:  No tenderness or deformity  Heart:  Regular rate and rhythm,  Abdomen:   Soft, non-tender,         Extremities: RIGHT WRIST:SKIN INTACT FINGERS WARM WELL PERFUSED GOOD CAP REFIL ABLE TO FLEX AND EXTEND WRIST  Pulses: 2+ and symmetric  Skin: Skin color, texture, turgor normal, no rashes or lesions     Neurologic: Normal    Assessment/Plan RIGHT WRIST DORSAL WRIST PAIN  RIGHT WRIST DIAGNOSITIC ARTHROSCOPY AND JOINT DEBRIDEMENT AND REPAIR AS INDICATED  R/B/A DISCUSSED WITH PT IN OFFICE.  PT VOICED UNDERSTANDING OF PLAN CONSENT SIGNED DAY OF SURGERY PT SEEN AND EXAMINED PRIOR TO OPERATIVE PROCEDURE/DAY OF SURGERY SITE MARKED. QUESTIONS ANSWERED WILL GO HOME FOLLOWING SURGERY  WE ARE PLANNING SURGERY FOR YOUR UPPER EXTREMITY. THE RISKS AND BENEFITS OF SURGERY INCLUDE BUT NOT LIMITED TO BLEEDING INFECTION, DAMAGE TO NEARBY NERVES ARTERIES TENDONS, FAILURE OF SURGERY TO ACCOMPLISH ITS INTENDED GOALS, PERSISTENT SYMPTOMS AND NEED FOR FURTHER SURGICAL INTERVENTION. WITH THIS IN MIND WE WILL PROCEED. I HAVE DISCUSSED WITH THE PATIENT THE PRE AND POSTOPERATIVE REGIMEN AND THE DOS AND DON'TS. PT VOICED UNDERSTANDING AND INFORMED CONSENT SIGNED. Sharma Covert 05/29/2015, 1:15 PM

## 2015-05-29 NOTE — Discharge Instructions (Signed)
KEEP BANDAGE CLEAN AND DRY °CALL OFFICE FOR F/U APPT [phone] IN 14 DAYS °KEEP HAND ELEVATED ABOVE HEART °OK TO APPLY ICE TO OPERATIVE AREA °CONTACT OFFICE IF ANY WORSENING PAIN OR CONCERNS. ° °      HAND SURGERY ° °  HOME CARE INSTRUCTIONS ° ° ° °The following instructions have been prepared to help you care for yourself upon your return home today. ° °Wound Care:  °Keep your hand elevated above the level of your heart. Do not allow it to dangle by your side. Keep the dressing dry and do not remove it unless your doctor advises you to do so. He will usually change it at the time of you post-op visit. Moving your fingers is advised to stimulate circulation but will depend on the site of your surgery. Of course, if you have a splint applied your doctor will advise you about movement. ° °Activity:  °Do not drive or operate machinery today. Rest today and then you may return to your normal activity and work as indicated by your physician. ° °Diet: °Drink liquids today or eat a light diet. You may resume a regular diet tomorrow. ° °General expectations: °Pain for two or three days. °Fingers may become slightly swollen.  ° °Unexpected Observations- Call your doctor if any of these occur: °Severe pain not relieved by pain medication. °Elevated temperature. °Dressing soaked with blood. °Inability to move fingers. °White or bluish color to fingers. ° ° ° ° ° °Post Anesthesia Home Care Instructions ° °Activity: °Get plenty of rest for the remainder of the day. A responsible adult should stay with you for 24 hours following the procedure.  °For the next 24 hours, DO NOT: °-Drive a car °-Operate machinery °-Drink alcoholic beverages °-Take any medication unless instructed by your physician °-Make any legal decisions or sign important papers. ° °Meals: °Start with liquid foods such as gelatin or soup. Progress to regular foods as tolerated. Avoid greasy, spicy, heavy foods. If nausea and/or vomiting occur, drink only clear  liquids until the nausea and/or vomiting subsides. Call your physician if vomiting continues. ° °Special Instructions/Symptoms: °Your throat may feel dry or sore from the anesthesia or the breathing tube placed in your throat during surgery. If this causes discomfort, gargle with warm salt water. The discomfort should disappear within 24 hours. ° °If you had a scopolamine patch placed behind your ear for the management of post- operative nausea and/or vomiting: ° °1. The medication in the patch is effective for 72 hours, after which it should be removed.  Wrap patch in a tissue and discard in the trash. Wash hands thoroughly with soap and water. °2. You may remove the patch earlier than 72 hours if you experience unpleasant side effects which may include dry mouth, dizziness or visual disturbances. °3. Avoid touching the patch. Wash your hands with soap and water after contact with the patch. °  ° °

## 2015-05-30 ENCOUNTER — Encounter (HOSPITAL_BASED_OUTPATIENT_CLINIC_OR_DEPARTMENT_OTHER): Payer: Self-pay | Admitting: Orthopedic Surgery

## 2015-05-30 NOTE — Op Note (Signed)
Alice Wagner, Alice Wagner            ACCOUNT NO.:  [id number]  MEDICAL RECORD NO.:  [id number]  LOCATION:                               FACILITY:  [facility]  PHYSICIAN:  Sharma Covert IV, M.D.DATE OF BIRTH:  02/26/1981  DATE OF PROCEDURE:  05/29/2015 DATE OF DISCHARGE:  05/29/2015                              OPERATIVE REPORT   PREOPERATIVE DIAGNOSES: 1. Right wrist central triangular fibrocartilage complex tear. 2. Right wrist dorsal carpal ganglion.  POSTOPERATIVE DIAGNOSES: 1. Right wrist central triangular fibrocartilage complex tear. 2. Right wrist dorsal carpal ganglion.  ATTENDING PHYSICIAN:  Sharma Covert, M.D., who has scrubbed and was present for the entire procedure.  ASSISTANT SURGEON:  None.  ANESTHESIA:  General via LMA.  TOURNIQUET TIME:  Zero minutes.  SURGICAL PROCEDURE:  Right wrist diagnostic arthroscopy and debridement of partial central triangular fibrocartilage complex tear and joint debridement to include removal of dorsal carpal ganglion cystic lesion.  SURGICAL INDICATIONS:  Alice Wagner is a right-hand-dominant female with persistent dorsal wrist pain greater than 6 months.  The patient had failed nonsurgical treatment options.  Risks, benefits, and alternatives were discussed in detail with the patient and a signed informed consent was obtained.  Risks included, but not limited to, bleeding, infection, damage to nearby nerves, arteries, or tendons, loss of motion of wrist and digits, incomplete relief of symptoms, and need for further surgical intervention.  DESCRIPTION OF PROCEDURE:  The patient was properly identified in the preop holding area and marked with permanent marker made on the right wrist to indicate the correct operative site.  The patient was then brought back to the operating room, placed supine on anesthesia table where general anesthesia was administered.  The patient tolerated this well.  A well-padded tourniquet was placed on  the right brachium and sealed with 1000 drape.  The right upper extremity was then prepped and draped in normal sterile fashion.  Time-out was called, correct side was identified, and procedure then begun.  10 mL of saline solution insufflated in the joint and limbs were tucked and wrist arthroscopy tower was then used and approximately 5-10 pounds counter traction was then applied.  A 3/4 portal was then established.  Small skin incision, blunt dissection carried down to the capsule and the joint entered. Arthroscopy was then carried out and showing continuity of the radioscaphoid capitate, short, long radiolunate ligaments, the SL ligament was in continuity.  The patient did have the dorsal cystic mass correlating with the MRI directly over the dorsal aspect of the SL ligament region.  Once this was identified, further inspection was then carried out Ulnarly where the patient did have some dorsal capsule thickening and invagination within the joint.  A 6R working portal was then established and joint debridement was then carried out with the small suction shaver.  This was aided in exposure of the thickened capsular tissue.  The LT ligament was inspected and noted to be in continuity.  The patient did have a very small perforation in the central portion of the TFCC, but no large, and this was then debrided back with a small suction shaver to a stable rim.  After small debridement of the TFCC, a  4/5 portal was then established and the instrumentation was then introduced and the joint debridement was then carried out of the dorsal capsular tissue and the cystic lesion directly over the SL ligament region.  After following this, the instrumentation was then entered into the midcarpal joint.  The midcarpal joint was then inspected.  No other abnormalities noted within the midcarpal joint.  No loose bodies.  No chondral fraying.  No incompetency of the SL ligament. Instrumentation was then  brought back into the radiocarpal joint where final joint debridement was then carried out.  The wound was then irrigated.  Thorough wound irrigation Wagner.  7 mL 0.25% Marcaine infiltrated locally.  Portal sites were then closed with 4-0 Prolene suture.  Sterile compressive bandage was then applied.  The patient was then placed in a well-padded volar splint, extubated, and taken to recovery room in good condition.  POSTPROCEDURAL PLAN:  The patient was discharged home, seen back in the office in approximately 2 weeks for wound check, sutures out, and to write a prescription for cast for a total of 4 weeks, and then short arm brace at the 4-week mark.  No radiographs at the first visit.     Alice Wagner, M.D.     FWO/MEDQ  D:  05/29/2015  T:  05/30/2015  Job:  [id number]

## 2015-06-15 ENCOUNTER — Emergency Department (HOSPITAL_COMMUNITY)
Admission: EM | Admit: 2015-06-15 | Discharge: 2015-06-15 | Disposition: A | Discharge status: Home / Self Care | Payer: Medicaid Other | Source: Home / Self Care | Attending: Family Medicine | Admitting: Family Medicine

## 2015-06-15 ENCOUNTER — Encounter (HOSPITAL_COMMUNITY): Payer: Self-pay | Admitting: Emergency Medicine

## 2015-06-15 DIAGNOSIS — L250 Unspecified contact dermatitis due to cosmetics: Secondary | ICD-10-CM | POA: Diagnosis not present

## 2015-06-15 MED ORDER — HYDROXYZINE HCL 25 MG PO TABS: 25.0000 mg | ORAL_TABLET | Freq: Four times a day (QID) | ORAL | Status: DC

## 2015-06-15 MED ORDER — METHYLPREDNISOLONE ACETATE 40 MG/ML IJ SUSP
80.0000 mg | Freq: Once | INTRAMUSCULAR | Status: AC
  Administered 2015-06-15: 80 mg via INTRAMUSCULAR

## 2015-06-15 MED ORDER — METHYLPREDNISOLONE ACETATE 80 MG/ML IJ SUSP
INTRAMUSCULAR | Status: AC
  Filled 2015-06-15: qty 1

## 2015-06-15 MED ORDER — TRIAMCINOLONE ACETONIDE 40 MG/ML IJ SUSP
40.0000 mg | Freq: Once | INTRAMUSCULAR | Status: AC
  Administered 2015-06-15: 40 mg via INTRAMUSCULAR

## 2015-06-15 MED ORDER — TRIAMCINOLONE ACETONIDE 40 MG/ML IJ SUSP
INTRAMUSCULAR | Status: AC
  Filled 2015-06-15: qty 1

## 2015-06-15 NOTE — ED Provider Notes (Signed)
CSN: [id number]     Arrival date & time 06/15/15  1520 History   First MD Initiated Contact with Patient 06/15/15 1629     Chief Complaint  Patient presents with  . Allergic Reaction   (Consider location/radiation/quality/duration/timing/severity/associated sxs/prior Treatment) Patient is a 34 y.o. female presenting with allergic reaction. The history is provided by the patient.  Allergic Reaction Presenting symptoms: itching and rash   Presenting symptoms: no difficulty breathing, no difficulty swallowing, no swelling and no wheezing   Severity:  Mild Context: cosmetics   Context comment:  [place] and was sprayed with aloe containing sunscreen which pt states she is allergic to, itching since Relieved by:  None tried Worsened by:  Nothing tried Ineffective treatments:  None tried   Past Medical History  Diagnosis Date  . Right wrist pain   . Ganglion of right wrist   . Wears glasses    Past Surgical History  Procedure Laterality Date  . Cesarean section  2012;  10-08-2009 ;  01-07-2005  . Wrist arthroscopy Right 05/29/2015    Procedure: RIGHT WRIST ARTHROSCOPY DEBRIDEMENT, ARTHROSCOPIC GANGLION CYST DECOMPRESSION;  Surgeon: Bradly Bienenstock, MD;  Location: [facility] [place];  Service: Orthopedics;  Laterality: Right;   No family history on file. Social History  Substance Use Topics  . Smoking status: Former Smoker -- 6 years    Types: Cigarettes    Quit date: 05/22/2013  . Smokeless tobacco: Never Used  . Alcohol Use: No   OB History    No data available     Review of Systems  Constitutional: Negative.   HENT: Negative for trouble swallowing.   Respiratory: Negative for wheezing.   Cardiovascular: Negative.   Skin: Positive for itching and rash.    Allergies  Aloe  Home Medications   Prior to Admission medications   Medication Sig Start Date End Date Taking? Authorizing Provider  docusate sodium (COLACE) 100 MG capsule Take 1 capsule  (100 mg total) by mouth 2 (two) times daily. 05/29/15   Bradly Bienenstock, MD  Hydrocodone-Acetaminophen (VICODIN) 5-300 MG TABS Take 1 tablet by mouth 4 (four) times daily as needed (PAIN). 05/29/15   Bradly Bienenstock, MD  hydrOXYzine (ATARAX/VISTARIL) 25 MG tablet Take 1 tablet (25 mg total) by mouth every 6 (six) hours. 06/15/15   Linna Hoff, MD  ibuprofen (ADVIL,MOTRIN) 200 MG tablet Take 200 mg by mouth every 6 (six) hours as needed.    Historical Provider, MD   Meds Ordered and Administered this Visit   Medications  triamcinolone acetonide (KENALOG-40) injection 40 mg (40 mg Intramuscular Given 06/15/15 1705)  methylPREDNISolone acetate (DEPO-MEDROL) injection 80 mg (80 mg Intramuscular Given 06/15/15 1705)    BP 108/63 mmHg  Pulse 85  Temp(Src) 98 F (36.7 C) (Oral)  Resp 16  SpO2 98% No data found.   Physical Exam  Constitutional: She is oriented to person, place, and time. She appears well-developed and well-nourished.  Neck: Normal range of motion. Neck supple.  Cardiovascular: Normal heart sounds.   Pulmonary/Chest: Breath sounds normal.  Neurological: She is alert and oriented to person, place, and time.  Skin: Skin is warm and dry. Rash noted.  Fine papular ext rash, patchy ,pruritic  Nursing note and vitals reviewed.   ED Course  Procedures (including critical care time)  Labs Review Labs Reviewed - No data to display  Imaging Review No results found.   Visual Acuity Review  Right Eye Distance:   Left Eye Distance:  Bilateral Distance:    Right Eye Near:   Left Eye Near:    Bilateral Near:         MDM   1. Contact dermatitis due to cosmetics        Linna Hoff, MD 06/15/15 [phone]

## 2015-06-15 NOTE — ED Notes (Signed)
Patient reports she used sunscreen yesterday.  Patient reports itching ever since.  Has bathed several times since onset. Unable to use benadryl-is allergic

## 2015-12-28 ENCOUNTER — Encounter (HOSPITAL_COMMUNITY): Payer: Self-pay | Admitting: *Deleted

## 2015-12-28 ENCOUNTER — Emergency Department (HOSPITAL_COMMUNITY)
Admission: EM | Admit: 2015-12-28 | Discharge: 2015-12-28 | Disposition: A | Discharge status: Home / Self Care | Payer: Medicaid Other | Attending: Emergency Medicine | Admitting: Emergency Medicine

## 2015-12-28 DIAGNOSIS — R197 Diarrhea, unspecified: Secondary | ICD-10-CM | POA: Diagnosis not present

## 2015-12-28 DIAGNOSIS — H60391 Other infective otitis externa, right ear: Secondary | ICD-10-CM

## 2015-12-28 DIAGNOSIS — J069 Acute upper respiratory infection, unspecified: Secondary | ICD-10-CM | POA: Diagnosis not present

## 2015-12-28 DIAGNOSIS — J029 Acute pharyngitis, unspecified: Secondary | ICD-10-CM | POA: Diagnosis present

## 2015-12-28 DIAGNOSIS — Z8739 Personal history of other diseases of the musculoskeletal system and connective tissue: Secondary | ICD-10-CM | POA: Diagnosis not present

## 2015-12-28 DIAGNOSIS — Z87891 Personal history of nicotine dependence: Secondary | ICD-10-CM | POA: Insufficient documentation

## 2015-12-28 DIAGNOSIS — H6691 Otitis media, unspecified, right ear: Secondary | ICD-10-CM | POA: Diagnosis not present

## 2015-12-28 DIAGNOSIS — Z79899 Other long term (current) drug therapy: Secondary | ICD-10-CM | POA: Diagnosis not present

## 2015-12-28 MED ORDER — IBUPROFEN 600 MG PO TABS: 600.0000 mg | ORAL_TABLET | Freq: Four times a day (QID) | ORAL | Status: DC | PRN

## 2015-12-28 MED ORDER — BACITRACIN ZINC 500 UNIT/GM EX OINT: 1.0000 "application " | TOPICAL_OINTMENT | Freq: Two times a day (BID) | CUTANEOUS | Status: DC

## 2015-12-28 NOTE — ED Notes (Signed)
Declined W/C at D/C and was escorted to lobby by RN. 

## 2015-12-28 NOTE — ED Notes (Signed)
Pt reports throat and ear pain starting on Friday.

## 2015-12-28 NOTE — ED Provider Notes (Signed)
CSN: [id number]     Arrival date & time 12/28/15  [id number] History  By signing my name below, I, Alice Wagner, attest that this documentation has been prepared under the direction and in the presence of Jaynie Crumbleatyana Trayvond Viets, [employer] CorporationPA-C Electronically Signed: Soijett Wagner, ED Scribe. 12/28/2015. 10:01 AM .   No chief complaint on file.    The history is provided by the patient. No language interpreter was used.    HPI Comments: Alice EtienneShytonya Wagner is a [age] y.o. female who presents to the Emergency Department complaining of sore throat onset 3 days. Pt notes that she thinks that she may have flu but unsure. She states that she is having associated symptoms of resolved fever of 101 x 2 days ago, cough, chills, right ear pain, and diarrhea. She states that she has tried tylenol PM for the relief for her symptoms. She denies vomiting and any other symptoms.   Past Medical History  Diagnosis Date  . Right wrist pain   . Ganglion of right wrist   . Wears glasses    Past Surgical History  Procedure Laterality Date  . Cesarean section  2012;  10-08-2009 ;  01-07-2005  . Wrist arthroscopy Right 05/29/2015    Procedure: RIGHT WRIST ARTHROSCOPY DEBRIDEMENT, ARTHROSCOPIC GANGLION CYST DECOMPRESSION;  Surgeon: Bradly BienenstockFred Ortmann, MD;  [facility] [place];  Service: Orthopedics;  Laterality: Right;   No family history on file. Social History  Substance Use Topics  . Smoking status: Former Smoker -- 6 years    Types: Cigarettes    Quit date: 05/22/2013  . Smokeless tobacco: Never Used  . Alcohol Use: No   OB History    No data available     Review of Systems  Constitutional: Positive for fever (resolved) and chills.  HENT: Positive for ear pain and sore throat.   Respiratory: Positive for cough.   Gastrointestinal: Positive for diarrhea. Negative for vomiting.      Allergies  Aloe  Home Medications   Prior to Admission medications   Medication Sig Start Date End Date Taking?  Authorizing Provider  docusate sodium (COLACE) 100 MG capsule Take 1 capsule (100 mg total) by mouth 2 (two) times daily. 05/29/15   Bradly BienenstockFred Ortmann, MD  Hydrocodone-Acetaminophen (VICODIN) 5-300 MG TABS Take 1 tablet by mouth 4 (four) times daily as needed (PAIN). 05/29/15   Bradly BienenstockFred Ortmann, MD  hydrOXYzine (ATARAX/VISTARIL) 25 MG tablet Take 1 tablet (25 mg total) by mouth every 6 (six) hours. 06/15/15   Linna HoffJames D Kindl, MD  ibuprofen (ADVIL,MOTRIN) 200 MG tablet Take 200 mg by mouth every 6 (six) hours as needed.    Historical Provider, MD   BP 123/80 mmHg  Pulse 89  Temp(Src) 98.2 F (36.8 C) (Oral)  Resp 16  SpO2 100% Physical Exam  Constitutional: She is oriented to person, place, and time. She appears well-developed and well-nourished. No distress.  HENT:  Head: Normocephalic and atraumatic.  Nose: Nose normal.  Mouth/Throat: Oropharynx is clear and moist.  Small pustule to the pinna of the right ear, just below the tragus. TTP. The rest of the ear helix, ear canal, TM normal with no signs of cellulitis or infection. Left TM normal.   Eyes: EOM are normal.  Neck: Normal range of motion. Neck supple.  Cardiovascular: Normal rate, regular rhythm, normal heart sounds and intact distal pulses.   Pulmonary/Chest: Effort normal. No respiratory distress. She has no wheezes. She has no rales.  Musculoskeletal: Normal range of motion.  Neurological: She  is alert and oriented to person, place, and time.  Skin: Skin is warm and dry.  Psychiatric: She has a normal mood and affect. Her behavior is normal.  Nursing note and vitals reviewed.   ED Course  Procedures (including critical care time) DIAGNOSTIC STUDIES: Oxygen Saturation is 100% on RA, nl by my interpretation.    COORDINATION OF CARE: 10:00 AM Discussed treatment plan with pt at bedside which includes symptomatic treatment, fluids, and warm compresses and pt agreed to plan.    Labs Review Labs Reviewed - No data to  display  Imaging Review No results found.    EKG Interpretation None      MDM   Final diagnoses:  Pinna infection, acute, right  URI (upper respiratory infection)    Patient in the emergency department with upper respiratory symptoms for several days. Also having some pain to the outer right ear. Ear exam is consistent with a pustule/pimple to the pinna of the ear. Will treat with warm compresses and topical antibiotics. Patient's upper respiratory symptoms are most likely viral. Her exam is unremarkable. Vital signs are all within normal. Will treat symptomatically with hyper-and Tylenol, oral fluids at home, follow-up with primary care doctor as needed  Filed Vitals:   12/28/15 0944  BP: 123/80  Pulse: 89  Temp: 98.2 F (36.8 C)  TempSrc: Oral  Resp: 16  SpO2: 100%    I personally performed the services described in this documentation, which was scribed in my presence. The recorded information has been reviewed and is accurate.   Jaynie Crumble, PA-C 12/28/15 1009  Lorre Nick, MD 12/28/15 [phone]

## 2015-12-28 NOTE — Discharge Instructions (Signed)
Apply warm compresses to the right ear. Apply bacitracin as prescribed. Ibuprofen and tylenol for pain. Follow up with primary care doctor for recheck.     Upper Respiratory Infection, Adult Most upper respiratory infections (URIs) are a viral infection of the air passages leading to the lungs. A URI affects the nose, throat, and upper air passages. The most common type of URI is nasopharyngitis and is typically referred to as "the common cold." URIs run their course and usually go away on their own. Most of the time, a URI does not require medical attention, but sometimes a bacterial infection in the upper airways can follow a viral infection. This is called a secondary infection. Sinus and middle ear infections are common types of secondary upper respiratory infections. Bacterial pneumonia can also complicate a URI. A URI can worsen asthma and chronic obstructive pulmonary disease (COPD). Sometimes, these complications can require emergency medical care and may be life threatening.  CAUSES Almost all URIs are caused by viruses. A virus is a type of germ and can spread from one person to another.  RISKS FACTORS You may be at risk for a URI if:   You smoke.   You have chronic heart or lung disease.  You have a weakened defense (immune) system.   You are very young or very old.   You have nasal allergies or asthma.  You work in crowded or poorly ventilated areas.  You work in health care facilities or schools. SIGNS AND SYMPTOMS  Symptoms typically develop 2-3 days after you come in contact with a cold virus. Most viral URIs last 7-10 days. However, viral URIs from the influenza virus (flu virus) can last 14-18 days and are typically more severe. Symptoms may include:   Runny or stuffy (congested) nose.   Sneezing.   Cough.   Sore throat.   Headache.   Fatigue.   Fever.   Loss of appetite.   Pain in your forehead, behind your eyes, and over your cheekbones  (sinus pain).  Muscle aches.  DIAGNOSIS  Your health care provider may diagnose a URI by:  Physical exam.  Tests to check that your symptoms are not due to another condition such as:  Strep throat.  Sinusitis.  Pneumonia.  Asthma. TREATMENT  A URI goes away on its own with time. It cannot be cured with medicines, but medicines may be prescribed or recommended to relieve symptoms. Medicines may help:  Reduce your fever.  Reduce your cough.  Relieve nasal congestion. HOME CARE INSTRUCTIONS   Take medicines only as directed by your health care provider.   Gargle warm saltwater or take cough drops to comfort your throat as directed by your health care provider.  Use a warm mist humidifier or inhale steam from a shower to increase air moisture. This may make it easier to breathe.  Drink enough fluid to keep your urine clear or pale yellow.   Eat soups and other clear broths and maintain good nutrition.   Rest as needed.   Return to work when your temperature has returned to normal or as your health care provider advises. You may need to stay home longer to avoid infecting others. You can also use a face mask and careful hand washing to prevent spread of the virus.  Increase the usage of your inhaler if you have asthma.   Do not use any tobacco products, including cigarettes, chewing tobacco, or electronic cigarettes. If you need help quitting, ask your health care provider.  PREVENTION  The best way to protect yourself from getting a cold is to practice good hygiene.   Avoid oral or hand contact with people with cold symptoms.   Wash your hands often if contact occurs.  There is no clear evidence that vitamin C, vitamin E, echinacea, or exercise reduces the chance of developing a cold. However, it is always recommended to get plenty of rest, exercise, and practice good nutrition.  SEEK MEDICAL CARE IF:   You are getting worse rather than better.   Your  symptoms are not controlled by medicine.   You have chills.  You have worsening shortness of breath.  You have brown or red mucus.  You have yellow or brown nasal discharge.  You have pain in your face, especially when you bend forward.  You have a fever.  You have swollen neck glands.  You have pain while swallowing.  You have white areas in the back of your throat. SEEK IMMEDIATE MEDICAL CARE IF:   You have severe or persistent:  Headache.  Ear pain.  Sinus pain.  Chest pain.  You have chronic lung disease and any of the following:  Wheezing.  Prolonged cough.  Coughing up blood.  A change in your usual mucus.  You have a stiff neck.  You have changes in your:  Vision.  Hearing.  Thinking.  Mood. MAKE SURE YOU:   Understand these instructions.  Will watch your condition.  Will get help right away if you are not doing well or get worse.   This information is not intended to replace advice given to you by your health care provider. Make sure you discuss any questions you have with your health care provider.   Document Released: 03/30/2001 Document Revised: 02/18/2015 Document Reviewed: 01/09/2014 Elsevier Interactive Patient Education [employer]2016 Elsevier Inc.

## 2016-08-24 ENCOUNTER — Ambulatory Visit: Payer: Medicaid Other | Attending: Orthopedic Surgery | Admitting: Occupational Therapy

## 2016-08-24 DIAGNOSIS — R208 Other disturbances of skin sensation: Secondary | ICD-10-CM

## 2016-08-24 DIAGNOSIS — R278 Other lack of coordination: Secondary | ICD-10-CM

## 2016-08-24 DIAGNOSIS — M6281 Muscle weakness (generalized): Secondary | ICD-10-CM | POA: Diagnosis present

## 2016-08-24 DIAGNOSIS — M25631 Stiffness of right wrist, not elsewhere classified: Secondary | ICD-10-CM

## 2016-08-24 DIAGNOSIS — M25531 Pain in right wrist: Secondary | ICD-10-CM | POA: Diagnosis present

## 2016-08-24 DIAGNOSIS — M25541 Pain in joints of right hand: Secondary | ICD-10-CM

## 2016-08-24 NOTE — Patient Instructions (Signed)
AROM: Wrist Extension   .  With _Rt___ palm down, bend wrist up. Repeat __15__ times per set.  Do __4-6__ sessions per day.    AROM: Wrist Flexion   With__Rt___ palm up, bend wrist up. Repeat __15__ times per set.  Do _4-6___ sessions per day.     AROM: Wrist Radial / Ulnar Deviation    Gently bend right wrist from side to side as far as possible. Repeat __15__ times per set. Do __1__ sets per session. Do _4-6___ sessions per day.   PROM: Wrist Flexion / Extension   Grasp  hand and slowly bend wrist until stretch is felt. Relax. Then stretch as far as possible in opposite direction. Be sure to keep elbow straight for bending wrist down.  Hold __10-20__ sec. each way. Alternate with prayer stretch to bring wrist back, hold 10-20 sec.  Repeat _5___ times per set.    Do _4-6___ sessions per day.  1. Grip Strengthening (Resistive Putty)   Squeeze putty using thumb and all fingers. Repeat _20___ times. Do __3__ sessions per day.   2. Roll putty into tube on table and pinch between each finger and thumb x 10 reps each. (Do ring and small finger together)       Desensitization Techniques  Perform these exercises ever 2 hours for 15 minute sessions.  Progress to the next exercises when the exercises you are doing become easy.  1)  Using light pressure, rub the various textures along with the hypersensitive area:  A.  Flannel  E.  Polyester  B.  Velvet  F.  Corduroy  C.  Wool  G.  Cotton material  D.  Terry cloth  2)  With the same textures use a firmer pressure. 3)  Use a hand held vibrator and massage along the sensitive area. 4)  With a small dowel rod, eraser on a pencil or base of an ink pen tap along the sensitive area. 5)  Use an empty roll-on deodorant bottle to roll along the sensitive area. 6)  Place your hand/forearm in separate containers of the following items:  A.  Sand  D.  Dry lentil beans  B.  Dry Rice  E.  Dry kidney beans  C.  Ball bearings F.   Dry pinto beans  Scar Massage Purpose: To soften/smooth scar tissue.   To desensitize sensitive areas after surgery.   To mechanically break up inner scar tissue, adhesions, therefore allowing freer        movement of injured tendons and muscle.  Technique: Use a cream to massage with, as it insures a smooth gliding motion and avoids irritation caused by rubbing skin to skin.  Cream is preferred over a lotion.   May begin as soon as any suture areas are healed.   Apply a firm, steady pressure with your finger-tip, pulling the skin in a circular motion over the scarred area.  Do not rub.  Message 5 minutes, at least 2 times a day, unless you are getting tender afterwards

## 2016-08-24 NOTE — Therapy (Signed)
[facility] [address] [place], [place], [address] Phone: [phone]   Fax:  [phone]  Occupational Therapy Evaluation  Patient Details  Name: Alice Wagner MRN: [id number] Date of Birth: Nov 10, 1980 Referring Provider: Dr. Melvyn Novasrtmann  Encounter Date: 08/24/2016      OT End of Session - 08/24/16 1316    Visit Number 1   Authorization Type MCD   Authorization Time Period Awaiting approval, however possibly won't cover d/t 60 days past surgery date   OT Start Time 1150   OT Stop Time 1250   OT Time Calculation (min) 60 min   Activity Tolerance Patient tolerated treatment well      Past Medical History:  Diagnosis Date  . Ganglion of right wrist   . Right wrist pain   . Wears glasses     Past Surgical History:  Procedure Laterality Date  . CESAREAN SECTION  2012;  10-08-2009 ;  01-07-2005  . WRIST ARTHROSCOPY Right 05/29/2015   Procedure: RIGHT WRIST ARTHROSCOPY DEBRIDEMENT, ARTHROSCOPIC GANGLION CYST DECOMPRESSION;  Surgeon: Bradly BienenstockFred Ortmann, MD;  [facility];  Service: Orthopedics;  Laterality: Right;    There were no vitals filed for this visit.      Subjective Assessment - 08/24/16 1159    Subjective  I had surgery last year too to this Rt wrist   Patient Stated Goals to use my hand more   Currently in Pain? Yes   Pain Score 10-Worst pain ever   Pain Location Wrist   Pain Orientation Right   Pain Descriptors / Indicators Throbbing   Pain Type Chronic pain   Pain Onset More than a month ago   Pain Frequency Intermittent   Aggravating Factors  cleaning, housework   Pain Relieving Factors rest, pain meds, hot pack           [facility] Assessment - 08/24/16 0001      Assessment   Diagnosis s/p arthrotomy   Referring Provider Dr. 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  No   Is the patient reluctant to leave their home because of a fear of falling?  No     Home  Environment   Additional Comments Pt's lives alone with 3 children (11, 6, and 5 y.o.)      Prior Function   Level of Independence Independent   Vocation Full time employment   Vocation Requirements [employer] prior to surgery but has not returned to work since recent surgery     ADL   Eating/Feeding Modified independent  using Lt hand mostly   Grooming Modified independent  using Lt hand   Upper Body Bathing Modified independent  using both hands   Lower Body Bathing Modified independent  using both hands   Upper Body Dressing Independent   Lower Body Dressing Modified independent   Toilet Tranfer Independent   Toileting - Clothing Manipulation Independent  Toileting -  Hygiene Independent     IADL   Shopping Needs to be accompanied on any shopping trip  to help lift groceries   Light Housekeeping Does personal laundry completely;Maintains house alone or with occasional assistance;Performs light daily tasks such as dishwashing, bed making   Meal Prep Plans, prepares and serves adequate meals independently   Community Mobility Relies on family or friends for transportation;Travels independently on public transportation   Medication Management Is responsible for taking medication in correct dosages at correct time   Development worker, communityinancial Management Manages financial matters independently (budgets, writes checks, pays rent, bills goes to bank), collects and keeps track of income     Mobility   Mobility Status Independent      Written Expression   Dominant Hand Right   Handwriting 100% legible;Increased time  holding pen differently     Vision - History   Baseline Vision Wears glasses all the time     Cognition   Overall Cognitive Status Within Functional Limits for tasks assessed     Sensation   Additional Comments hypersensitive, intermittent tingling/sharp pain     Coordination   9 Hole Peg Test Right;Left   Right 9 Hole Peg Test 33.37 sec   Left 9 Hole Peg Test 21.38 sec     Edema   Edema mild Rt hand     ROM / Strength   AROM / PROM / Strength AROM     AROM   Overall AROM Comments BUE shoulder, elbow, forearm WNL's. Lt wrist/hand WNL's. Rt hand motion WFL's but stiffness d/t guarding and learned non-use. Rt wrist flex = 25*, ext = 30*, RD = 10*, UD = 15*. (P/ROM wrist flex = 30*, ext = 48* with pain 10/10)     Hand Function   Right Hand Grip (lbs) 10 lbs   Left Hand Grip (lbs) 60 lbs                  OT Treatments/Exercises (OP) - 08/24/16 0001      ADLs   ADL Comments Pt educated in desensitization techniques d/t hypersensitivity Rt wrist/hand and instructed in how to perform at home - see pt instructions. Also discussed MCD limitations and possibility of MCD not covering 3 visits secondary to surgery being over 60 days. Pt to inquire for financial assist plan     Exercises   Exercises Wrist;Hand     Wrist Exercises   Other wrist exercises Pt issued A/ROM and P/ROM wrist HEP - See pt instructions for details     Hand Exercises   Other Hand Exercises Pt issued putty HEP and yellow putty - see pt instructions for details               OT Education - 08/24/16 1314    Education provided Yes   Education Details Wrist A/ROM and P/ROM HEP, Putty HEP, desensitization techniques   Person(s) Educated Patient   Methods Explanation;Demonstration;Handout   Comprehension Verbalized understanding             OT Long Term Goals - 08/24/16 1324      OT LONG TERM GOAL  #1   Title Independent with HEP    Baseline issued - will need updates   Time 6   Period Weeks   Status On-going     OT LONG TERM GOAL #2   Title pt to verbalize understanding with pain management strategies and desensitization techniques   Baseline began education, will need review/udpates  Time 6   Period Weeks   Status On-going     OT LONG TERM GOAL #3   Title Grip strength Rt hand to improve by 10 lbs or greater to assist with opening containers/jars   Baseline eval = 10 lbs (Lt = 60 lbs)    Time 6   Period Weeks   Status New     OT LONG TERM GOAL #4   Title Pt to improve wrist flexion and extension by 10 degrees or greater for functional use   Baseline eval: wrist flex = 25*, ext = 30*    Time 6   Period Weeks   Status New     OT LONG TERM GOAL #5   Title Pt to return to using Rt hand as dominant hand for BADLS    Baseline using Lt non dominant hand primarily   Time 6   Period Weeks   Status New               Plan - 08/24/16 1318    Clinical Impression Statement Pt is a 35 y.o. Rt dominant female who presents to outpatient rehab s/p Rt wrist arthrotomy ([id number]) on 04/26/16. Pt with significant deficits in Rt dominant hand including: severe pain, learned non-use, hypersensitivity, decr. ROM, decr. strength and inability to perform ADLS with Rt hand. Pt would benefit from O.T. to address these deficits and increase ease and independence with ADLS    Rehab Potential Fair   Clinical Impairments Affecting Rehab Potential time since surgery, severity of deficits, MCD limitations   OT Frequency --  3 visits over 6 weeks requested from [facility]   OT Treatment/Interventions Self-care/ADL training;Moist Heat;Fluidtherapy;DME and/or AE instruction;Splinting;Patient/family education;Compression bandaging;Contrast Bath;Ultrasound;Therapeutic activities;Cryotherapy;Passive range of motion;Parrafin;Electrical Stimulation;Manual Therapy   Plan fluidotherapy, review all  HEP's/educ., progress to wrist strengthening as tolerated, check MCD coverage - if MCD denies d/t surgery date, pt will need to sign self pay waiver - pt aware of possibility and agrees to 2-3 visits      Patient will benefit from skilled therapeutic intervention in order to improve the following deficits and impairments:     Visit Diagnosis: Pain in right wrist - Plan: Ot plan of care cert/re-cert  Pain in joints of right hand - Plan: Ot plan of care cert/re-cert  Other disturbances of skin sensation - Plan: Ot plan of care cert/re-cert  Stiffness of right wrist, not elsewhere classified - Plan: Ot plan of care cert/re-cert  Muscle weakness (generalized) - Plan: Ot plan of care cert/re-cert  Other lack of coordination - Plan: Ot plan of care cert/re-cert    Problem List There are no active problems to display for this patient.   Kelli Churn, OTR/L  08/24/2016, 1:30 PM  [facility] [address] [place], [place], [address] Phone: [phone]   Fax:  [phone]  Name: Alice Wagner MRN: [id number] Date of Birth: 1981/10/16

## 2016-09-01 ENCOUNTER — Ambulatory Visit: Payer: Medicaid Other | Admitting: Occupational Therapy

## 2016-09-15 ENCOUNTER — Ambulatory Visit: Payer: Medicaid Other | Admitting: Occupational Therapy

## 2016-09-15 DIAGNOSIS — R278 Other lack of coordination: Secondary | ICD-10-CM

## 2016-09-15 DIAGNOSIS — M6281 Muscle weakness (generalized): Secondary | ICD-10-CM

## 2016-09-15 DIAGNOSIS — M25631 Stiffness of right wrist, not elsewhere classified: Secondary | ICD-10-CM

## 2016-09-15 DIAGNOSIS — R208 Other disturbances of skin sensation: Secondary | ICD-10-CM

## 2016-09-15 DIAGNOSIS — M25531 Pain in right wrist: Secondary | ICD-10-CM | POA: Diagnosis not present

## 2016-09-15 DIAGNOSIS — M25541 Pain in joints of right hand: Secondary | ICD-10-CM

## 2016-09-15 NOTE — Patient Instructions (Signed)
AROM: Wrist Extension   .  With _right___ palm down, bend wrist up.hold 1 lbs weight or water bottle Repeat __15__ times per set.  Do __1-2_ sessions per day.    AROM: Wrist Flexion   With_right____ palm up, bend wrist up. Hold 1 lb weight or water bottle Repeat __15__ times per set.  Do _1-2__ sessions per day.

## 2016-09-15 NOTE — Therapy (Signed)
[facility] [address] [place], [place], [address] Phone: [phone]   Fax:  [phone]  Occupational Therapy Treatment  Patient Details  Name: Neola Dockweiler MRN: [id number] Date of Birth: 18-May-1981 Referring Provider: Dr. Melvyn Novas  Encounter Date: 09/15/2016      OT End of Session - 09/15/16 1255    Visit Number 2   Number of Visits 4   Date for OT Re-Evaluation 10/10/16   Authorization Type MCD   Authorization Time Period 3 visits approved through 10/10/16   Authorization - Visit Number 1   Authorization - Number of Visits 3   OT Start Time 0805   OT Stop Time 0845   OT Time Calculation (min) 40 min      Past Medical History:  Diagnosis Date  . Ganglion of right wrist   . Right wrist pain   . Wears glasses     Past Surgical History:  Procedure Laterality Date  . CESAREAN SECTION  2012;  10-08-2009 ;  01-07-2005  . WRIST ARTHROSCOPY Right 05/29/2015   Procedure: RIGHT WRIST ARTHROSCOPY DEBRIDEMENT, ARTHROSCOPIC GANGLION CYST DECOMPRESSION;  Surgeon: Bradly Bienenstock, MD;  Location[facility];  Service: Orthopedics;  Laterality: Right;    There were no vitals filed for this visit.      Subjective Assessment - 09/15/16 0820    Subjective  Pt reports continued right wrist pain   Patient Stated Goals to use my hand more   Currently in Pain? Yes   Pain Score 6    Pain Location Wrist   Pain Orientation Right   Pain Descriptors / Indicators Aching   Pain Type Chronic pain   Pain Onset More than a month ago   Pain Frequency Intermittent   Aggravating Factors  use   Pain Relieving Factors rest, pain meds, heat           Treatment: Fluidotherapy x10 mins, to RUE for pain, stiffness and desensitization, no adverse reactions Therapist reviewed the HEP issued last visit for A/ROM and light strengthening with yellow putty. Scar massage to dorsal wrist. A/ROM wrist flexion/  extension with 1 lbs weight x10 reps, min v.c. Graded clothespins yellow-green for sustained pinch, min difficulty                   OT Education - 09/15/16 1257    Education provided Yes   Education Details HEP review, scar massage, and weighted wrist flexion/ extension with 1 lbs weight   Person(s) Educated Patient   Methods Explanation;Demonstration;Verbal cues;Handout   Comprehension Verbalized understanding;Returned demonstration;Verbal cues required             OT Long Term Goals - 08/24/16 1324      OT LONG TERM GOAL #1   Title Independent with HEP    Baseline issued - will need updates   Time 6   Period Weeks   Status On-going     OT LONG TERM GOAL #2   Title pt to verbalize understanding with pain management strategies and desensitization techniques   Baseline began education, will need review/udpates   Time 6   Period Weeks   Status On-going     OT LONG TERM GOAL #3   Title Grip strength Rt hand to improve by 10 lbs or greater to assist with opening containers/jars   Baseline eval = 10 lbs (Lt = 60 lbs)    Time 6   Period Weeks   Status New  OT LONG TERM GOAL #4   Title Pt to improve wrist flexion and extension by 10 degrees or greater for functional use   Baseline eval: wrist flex = 25*, ext = 30*    Time 6   Period Weeks   Status New     OT LONG TERM GOAL #5   Title Pt to return to using Rt hand as dominant hand for BADLS    Baseline using Lt non dominant hand primarily   Time 6   Period Weeks   Status New               Plan - 09/15/16 0829    Clinical Impression Statement Pt is progressing towards goals. She remains limited by pain and hypersensitivity   Rehab Potential Fair   Clinical Impairments Affecting Rehab Potential time since surgery, severity of deficits, MCD limitations   OT Frequency --  3 visits   OT Duration 6 weeks   OT Treatment/Interventions Self-care/ADL training;Moist Heat;Fluidtherapy;DME and/or AE  instruction;Splinting;Patient/family education;Compression bandaging;Contrast Bath;Ultrasound;Therapeutic activities;Cryotherapy;Passive range of motion;Parrafin;Electrical Stimulation;Manual Therapy   Plan progress HEP, modalities      Patient will benefit from skilled therapeutic intervention in order to improve the following deficits and impairments:  Increased edema, Impaired flexibility, Impaired sensation, Decreased range of motion, Impaired UE functional use, Impaired perceived functional ability, Decreased coordination  Visit Diagnosis: Pain in right wrist  Pain in joints of right hand  Other disturbances of skin sensation  Stiffness of right wrist, not elsewhere classified  Muscle weakness (generalized)  Other lack of coordination    Problem List There are no active problems to display for this patient.   Donnell Beauchamp 09/15/2016, 12:58 PM  [facility] [address] [place], [place], [address] Phone: [phone]   Fax:  [phone]  Name: Gizzel Hoffmeister MRN: [id number] Date of Birth: 10-Aug-1981

## 2016-09-21 ENCOUNTER — Ambulatory Visit: Payer: Medicaid Other | Attending: Orthopedic Surgery | Admitting: Occupational Therapy

## 2016-09-21 DIAGNOSIS — M6281 Muscle weakness (generalized): Secondary | ICD-10-CM | POA: Insufficient documentation

## 2016-09-21 DIAGNOSIS — M25531 Pain in right wrist: Secondary | ICD-10-CM | POA: Insufficient documentation

## 2016-09-21 DIAGNOSIS — M25541 Pain in joints of right hand: Secondary | ICD-10-CM | POA: Insufficient documentation

## 2016-09-21 DIAGNOSIS — R208 Other disturbances of skin sensation: Secondary | ICD-10-CM | POA: Insufficient documentation

## 2016-09-22 ENCOUNTER — Encounter: Payer: Self-pay | Admitting: *Deleted

## 2016-09-22 ENCOUNTER — Ambulatory Visit: Payer: Medicaid Other | Admitting: *Deleted

## 2016-09-22 DIAGNOSIS — R208 Other disturbances of skin sensation: Secondary | ICD-10-CM | POA: Diagnosis not present

## 2016-09-22 DIAGNOSIS — M6281 Muscle weakness (generalized): Secondary | ICD-10-CM

## 2016-09-22 DIAGNOSIS — M25531 Pain in right wrist: Secondary | ICD-10-CM

## 2016-09-22 DIAGNOSIS — M25541 Pain in joints of right hand: Secondary | ICD-10-CM | POA: Diagnosis present

## 2016-09-22 NOTE — Therapy (Signed)
[facility] [address] [place], [place], [address] Phone: [phone]   Fax:  [phone]  Occupational Therapy Treatment  Patient Details  Name: Alice Wagner MRN: [id number] Date of Birth: 07-Oct-1981 Referring Provider: Dr. Melvyn Novas  Encounter Date: 09/22/2016      OT End of Session - 09/22/16 1154    Visit Number 3   Number of Visits 4   Date for OT Re-Evaluation 10/10/16   Authorization Type MCD   Authorization Time Period 3 visits approved through 10/10/16   Authorization - Visit Number 2   Authorization - Number of Visits 3   OT Start Time 0848   OT Stop Time 0931   OT Time Calculation (min) 43 min   Activity Tolerance Patient tolerated treatment well;No increased pain   Behavior During Therapy WFL for tasks assessed/performed      Past Medical History:  Diagnosis Date  . Ganglion of right wrist   . Right wrist pain   . Wears glasses     Past Surgical History:  Procedure Laterality Date  . CESAREAN SECTION  2012;  10-08-2009 ;  01-07-2005  . WRIST ARTHROSCOPY Right 05/29/2015   Procedure: RIGHT WRIST ARTHROSCOPY DEBRIDEMENT, ARTHROSCOPIC GANGLION CYST DECOMPRESSION;  Surgeon: Bradly Bienenstock, MD;  Location[facility];  Service: Orthopedics;  Laterality: Right;    There were no vitals filed for this visit.      Subjective Assessment - 09/22/16 1145    Subjective  Pt denies pain today but reports that she fatigues quickly after use and edema and pain occurs if she "does too much."   Patient Stated Goals to use my hand more   Currently in Pain? No/denies   Pain Score 0-No pain   Multiple Pain Sites No            [facility] OT Assessment - 09/22/16 0001      ROM / Strength   AROM / PROM / Strength AROM     AROM   Overall AROM Comments AROM Right wrist, RD = 20*, UD = 32* A/ROM wrist flex = 70*, ext = 52* painfree                  OT Treatments/Exercises (OP) -  09/22/16 0001      ADLs   ADL Comments Reviewed desensitization program and techniques. Pt reports that she inquired about MCD approval and that she is approved for 3 visits before 10/10/16. She is scheduled for remaining appointment on 10/05/16     Exercises   Exercises Wrist;Hand     Wrist Exercises   Other wrist exercises Pt performed A/ROM and P/ROM wrist HEP.   Other wrist exercises 1# weight for wrist flexion and extension with forearm pronated and supinated x2 sets of 15 each; gripper  while picking up 1" blocks nd placing in container x20 blocks (spring in position 1); graded clothes pins (yellow, red, green, blue & black) for sustained pinch.      Hand Exercises   Other Hand Exercises Increased putty resistance to red/medium-soft for HEP for grip and pinches - see pt instructions for details     Modalities   Modalities Fluidotherapy     RUE Fluidotherapy   Number Minutes Fluidotherapy 10 Minutes   RUE Fluidotherapy Location Hand;Wrist;Forearm   Comments Pt performing AROM while in fluidotherapy for right wrist, digits and forearm to sasist with increased flexibility, ROM and decreased pain.     Manual Therapy   Manual  Therapy Other (comment)   Manual therapy comments Scar massage, desensitization and mobilization R dorsal wrist x5 min. Pt tolerated w/o c/o increased pain or soreness. No adverse reactions.                OT Education - 09/22/16 1153    Education provided Yes   Education Details Hep review, upgraded to red putty, cont with functional use for ADL's and IADL's. D/c planning begun today in clinic.   Person(s) Educated Patient   Methods Explanation;Demonstration   Comprehension Verbalized understanding;Returned demonstration             OT Long Term Goals - 09/22/16 1159      OT LONG TERM GOAL #1   Title Independent with HEP    Baseline issued - will need updates   Time 6   Period Weeks   Status On-going     OT LONG TERM GOAL #2   Title  pt to verbalize understanding with pain management strategies and desensitization techniques   Baseline began education, will need review/udpates   Time 6   Period Weeks   Status On-going     OT LONG TERM GOAL #3   Title Grip strength Rt hand to improve by 10 lbs or greater to assist with opening containers/jars   Baseline eval = 10 lbs (Lt = 60 lbs)    Time 6   Period Weeks     OT LONG TERM GOAL #4   Title Pt to improve wrist flexion and extension by 10 degrees or greater for functional use   Baseline eval: wrist flex = 25*, ext = 30*; 09/22/16 wrist flex = 70*; ext = 52*, RD = 20*, UD = 34*   Time 6   Period Weeks   Status Achieved     OT LONG TERM GOAL #5   Title Pt to return to using Rt hand as dominant hand for BADLS    Baseline using Lt non dominant hand primarily at Eval. 09/22/16 - pt reports using R hand as dominant UE for BADLS   Time 6   Period Weeks   Status Achieved               Plan - 09/22/16 1154    Clinical Impression Statement Pt cont to c/o hypersensitivity along scar but reports improvement with desensitization techniques. Excellent gains with AROM R wrist noted as well today in clinic. Increased resisitance for putty should assist with strengthening and return to function R dominant hand/wrist for ADL's and work related activities.   Rehab Potential Fair   Clinical Impairments Affecting Rehab Potential time since surgery, severity of deficits, MCD limitations   OT Frequency --  3 visits   OT Duration 6 weeks   OT Treatment/Interventions Self-care/ADL training;Moist Heat;Fluidtherapy;DME and/or AE instruction;Splinting;Patient/family education;Compression bandaging;Contrast Bath;Ultrasound;Therapeutic activities;Cryotherapy;Passive range of motion;Parrafin;Electrical Stimulation;Manual Therapy   Plan Check LTG's, anticipate d/c to independent home program. Check pain.   Consulted and Agree with Plan of Care Patient      Patient will benefit from  skilled therapeutic intervention in order to improve the following deficits and impairments:  Increased edema, Impaired flexibility, Impaired sensation, Decreased range of motion, Impaired UE functional use, Impaired perceived functional ability, Decreased coordination  Visit Diagnosis: Other disturbances of skin sensation  Pain in right wrist  Pain in joints of right hand  Muscle weakness (generalized)    Problem List There are no active problems to display for this patient.   [place], Amy Progress EnergyBeth Dixon,  OTR/L 09/22/2016, 12:03 PM  [facility] [address] [place], [place], [address] Phone: [phone]   Fax:  [phone]  Name: Alice Wagner MRN: [id number] Date of Birth: 04/04/1981

## 2016-10-05 ENCOUNTER — Ambulatory Visit: Payer: Medicaid Other | Admitting: Occupational Therapy

## 2017-04-16 ENCOUNTER — Encounter (HOSPITAL_COMMUNITY): Payer: Self-pay | Admitting: Emergency Medicine

## 2017-04-16 ENCOUNTER — Ambulatory Visit (HOSPITAL_COMMUNITY)
Admission: EM | Admit: 2017-04-16 | Discharge: 2017-04-16 | Disposition: A | Discharge status: Home / Self Care | Payer: Medicaid Other | Attending: Family Medicine | Admitting: Family Medicine

## 2017-04-16 DIAGNOSIS — M25531 Pain in right wrist: Secondary | ICD-10-CM

## 2017-04-16 DIAGNOSIS — B86 Scabies: Secondary | ICD-10-CM

## 2017-04-16 MED ORDER — HYDROCODONE-ACETAMINOPHEN 5-300 MG PO TABS: 1.0000 | ORAL_TABLET | Freq: Four times a day (QID) | ORAL | 0 refills | Status: DC | PRN

## 2017-04-16 MED ORDER — PERMETHRIN 5 % EX CREA: TOPICAL_CREAM | CUTANEOUS | 0 refills | Status: DC

## 2017-04-16 NOTE — ED Triage Notes (Signed)
Pt c/o intermittent right hand/wrist pain onset 2 weeks that has become more constant  Hx of right hand surgery  Denies inj/trauma  A&O x4... NAD... Ambulatory

## 2017-04-16 NOTE — ED Provider Notes (Signed)
  [facility]   [id number] 04/16/17 Arrival Time: 1456  ASSESSMENT & PLAN:  Today you were diagnosed with the following: 1. Right wrist pain   2. Scabies - possible    You have been prescribed prescription medications this visit.  Marland Kitchen. permethrin (ELIMITE) 5 % cream    Sig: Apply neck down before bed then wash off in morning.    Dispense:  60 g    Refill:  0  . Hydrocodone-Acetaminophen (VICODIN) 5-300 MG TABS    Sig: Take 1 tablet by mouth 4 (four) times daily as needed (PAIN).    Dispense:  12 each    Refill:  0   Be aware that pain medications may cause drowsiness and are for short-term use only. Continue to take ibuprofen with food. Wear wrist splint for the next several days to 1 week. If not improving please schedule follow up with your hand surgeon. Please read information given regarding possibility of scabies.  If you are not improving over the next few days or feel you are worsening please follow up here or the Emergency Department if you are unable to see your regular doctor.  Reviewed expectations re: course of current medical issues. Questions answered. Outlined signs and symptoms indicating need for more acute intervention. Patient verbalized understanding. After Visit Summary given.   SUBJECTIVE:  Alice EtienneShytonya Stebbins is a 36 y.o. female who presents with complaint of intermittent right hand/wrist pain for the past 2 weeks. More constant now. History of surgery for tenosynovitis to this hand. No specific injury or trauma. "Just sore". No swelling or skin changes. No self treatment. No sensation changes or weakness. Pain interfering with sleep.  Also she has been itching significantly for a few weeks. More in axilla and groin area. No rashes or skin changes reported. No recent travel reported. No OTC treatment.  ROS: As per HPI.   OBJECTIVE:  Vitals:   04/16/17 1507  BP: 115/77  Pulse: 75  Resp: 16  Temp: 98.6 F (37 C)  TempSrc: Oral  SpO2: 100%       General appearance: alert, cooperative, appears stated age and no distress Extremities: R hand with tenderness over dorsal area nearer thumb; no obvious acute abnormality MSK: symmetrical with no gross deformities Skin: warm and dry; no rashes or lesions Neuro: sensation intact in upper extremities; normal strength   Allergies  Allergen Reactions  . Aloe Other (See Comments)    edema    PMHx, SurgHx, SocialHx, Medications, and Allergies were reviewed in the Visit Navigator and updated as appropriate.       Mardella LaymanHagler, Zaydon Kinser, MD 04/18/17 [phone]

## 2017-04-16 NOTE — Discharge Instructions (Addendum)
Today you were diagnosed with the following: 1. Right wrist pain   2. Scabies     You have been prescribed prescription medications this visit.   permethrin (ELIMITE) 5 % cream    Sig: Apply neck down before bed then wash off in morning.    Dispense:  60 g    Refill:  0   Hydrocodone-Acetaminophen (VICODIN) 5-300 MG TABS    Sig: Take 1 tablet by mouth 4 (four) times daily as needed (PAIN).    Dispense:  12 each    Refill:  0   Be aware that pain medications may cause drowsiness and are for short-term use only. Wear wrist splint for the next several days to 1 week. If not improving please schedule follow up with your hand surgeon. Please read information given regarding possibility of scabies.  If you are not improving over the next few days or feel you are worsening please follow up here or the Emergency Department if you are unable to see your regular doctor.

## 2017-06-29 ENCOUNTER — Encounter: Payer: Self-pay | Admitting: Occupational Therapy

## 2017-06-29 NOTE — Therapy (Signed)
[facility] [address] [place], [place], [address] Phone: [phone]   Fax:  [phone]  Patient Details  Name: Alice Wagner MRN: [id number] Date of Birth: Apr 26, 1981 Referring Provider:  No ref. provider found  OCCUPATIONAL THERAPY DISCHARGE SUMMARY  Visits from Start of Care: 3   Plan: Patient agrees to discharge.  Patient goals were partially met. Patient is being discharged due to not returning since the last visit.  ?????      Mariah Milling, OTR/L 06/29/2017, 3:09 PM  [facility] [address] [place], [place], [address] Phone: [phone]   Fax:  [phone]

## 2017-12-02 ENCOUNTER — Encounter (HOSPITAL_COMMUNITY): Payer: Self-pay

## 2017-12-02 ENCOUNTER — Emergency Department (HOSPITAL_COMMUNITY)
Admission: EM | Admit: 2017-12-02 | Discharge: 2017-12-02 | Disposition: A | Discharge status: Home / Self Care | Payer: Medicaid Other | Attending: Emergency Medicine | Admitting: Emergency Medicine

## 2017-12-02 ENCOUNTER — Other Ambulatory Visit: Payer: Self-pay

## 2017-12-02 DIAGNOSIS — Z87891 Personal history of nicotine dependence: Secondary | ICD-10-CM | POA: Diagnosis not present

## 2017-12-02 DIAGNOSIS — R51 Headache: Secondary | ICD-10-CM | POA: Insufficient documentation

## 2017-12-02 DIAGNOSIS — R519 Headache, unspecified: Secondary | ICD-10-CM

## 2017-12-02 DIAGNOSIS — Z79899 Other long term (current) drug therapy: Secondary | ICD-10-CM | POA: Diagnosis not present

## 2017-12-02 LAB — I-STAT BETA HCG BLOOD, ED (MC, WL, AP ONLY): I-stat hCG, quantitative: <5 m[IU]/mL (ref <5)

## 2017-12-02 MED ORDER — SODIUM CHLORIDE 0.9 % IV BOLUS (SEPSIS): 500.0000 mL | Freq: Once | INTRAVENOUS | Status: DC

## 2017-12-02 MED ORDER — SODIUM CHLORIDE 0.9 % IV BOLUS (SEPSIS)
1000.0000 mL | Freq: Once | INTRAVENOUS | Status: AC
  Administered 2017-12-02: 1000 mL via INTRAVENOUS

## 2017-12-02 MED ORDER — SUMATRIPTAN SUCCINATE 50 MG PO TABS: ORAL_TABLET | ORAL | 0 refills | Status: DC

## 2017-12-02 MED ORDER — PROCHLORPERAZINE EDISYLATE 5 MG/ML IJ SOLN
10.0000 mg | Freq: Once | INTRAMUSCULAR | Status: AC
  Administered 2017-12-02: 10 mg via INTRAVENOUS
  Filled 2017-12-02: qty 2

## 2017-12-02 MED ORDER — KETOROLAC TROMETHAMINE 30 MG/ML IJ SOLN
30.0000 mg | Freq: Once | INTRAMUSCULAR | Status: AC
  Administered 2017-12-02: 30 mg via INTRAVENOUS
  Filled 2017-12-02: qty 1

## 2017-12-02 MED ORDER — ONDANSETRON 4 MG PO TBDP: 4.0000 mg | ORAL_TABLET | Freq: Three times a day (TID) | ORAL | 0 refills | Status: DC | PRN

## 2017-12-02 MED ORDER — DIPHENHYDRAMINE HCL 50 MG/ML IJ SOLN
25.0000 mg | Freq: Once | INTRAMUSCULAR | Status: AC
  Administered 2017-12-02: 25 mg via INTRAVENOUS
  Filled 2017-12-02: qty 1

## 2017-12-02 NOTE — Discharge Instructions (Signed)
If you feel 1 of your usual headaches coming on, you may take 1 tablet of sumatriptan.  If the headache does not improve, you may take 1 additional tablet in 2 hours.  Please do not take more than 2 doses at a time.  You may take 1 tablet of Zofran and let it dissolve under the tongue every 8 hours as needed for nausea and vomiting.  It is important to stay hydrated when you have a headache so please drink plenty of fluids.  Please call and schedule a follow-up appointment with your primary care provider regarding today's visit or if you develop more frequent headaches.  If you develop fever, chills, neck stiffness, persistent vomiting despite taking Zofran, changes in your vision, or other new concerning symptoms, please return to the emergency department for reevaluation.

## 2017-12-02 NOTE — ED Provider Notes (Signed)
[facility] EMERGENCY DEPARTMENT Provider Note   CSN: [id number] Arrival date & time: 12/02/17  0708     History   Chief Complaint Chief Complaint  Patient presents with  . Headache    HPI Alice Wagner is a 37 y.o. female who presents to the emergency department with a chief complaint of headache.  The headache has been constant for the last 2 days.  She reports the pain will be present over the right frontotemporal area and sometimes will move to the left frontotemporal area, but it is currently present on the right.  She reports associated photophobia, phonophobia, intermittent nausea, and NBNB emesis x1, which occurred just prior to arrival.  She also reports right-sided posterior neck pain that began 3 days ago when she awoke it is worse with turning her head to the right, and nasal congestion.  She denies fever, chills, neck stiffness, sinus pain or pressure, dizziness, lightheadedness, tinnitus, otalgia, confusion, disequilibrium, visual changes, diarrhea, abdominal pain, chest pain, dyspnea, weakness, numbness, or syncope.  She reports a history of similar headaches.  She is treated her symptoms with 400 mg of ibuprofen x2 without relief.  No pertinent past medical history or daily medications.  She is also concerned that she may be pregnant.  The history is provided by the patient. No language interpreter was used.    Past Medical History:  Diagnosis Date  . Ganglion of right wrist   . Right wrist pain   . Wears glasses     There are no active problems to display for this patient.   Past Surgical History:  Procedure Laterality Date  . CESAREAN SECTION  2012;  10-08-2009 ;  01-07-2005  . WRIST ARTHROSCOPY Right 05/29/2015   Procedure: RIGHT WRIST ARTHROSCOPY DEBRIDEMENT, ARTHROSCOPIC GANGLION CYST DECOMPRESSION;  Surgeon: Bradly Bienenstock, MD;  Location: [facility];  Service: Orthopedics;  Laterality: Right;    OB History    No  data available       Home Medications    Prior to Admission medications   Medication Sig Start Date End Date Taking? Authorizing Provider  bacitracin ointment Apply 1 application topically 2 (two) times daily. Patient not taking: Reported on 08/24/2016 12/28/15   Jaynie Crumble, PA-C  docusate sodium (COLACE) 100 MG capsule Take 1 capsule (100 mg total) by mouth 2 (two) times daily. Patient not taking: Reported on 08/24/2016 05/29/15   Bradly Bienenstock, MD  Hydrocodone-Acetaminophen (VICODIN) 5-300 MG TABS Take 1 tablet by mouth 4 (four) times daily as needed (PAIN). 04/16/17   Mardella Layman, MD  hydrOXYzine (ATARAX/VISTARIL) 25 MG tablet Take 1 tablet (25 mg total) by mouth every 6 (six) hours. Patient not taking: Reported on 08/24/2016 06/15/15   Linna Hoff, MD  ibuprofen (ADVIL,MOTRIN) 200 MG tablet Take 200 mg by mouth every 6 (six) hours as needed.    [provider]  ibuprofen (ADVIL,MOTRIN) 600 MG tablet Take 1 tablet (600 mg total) by mouth every 6 (six) hours as needed. Patient not taking: Reported on 08/24/2016 12/28/15   Jaynie Crumble, PA-C  ondansetron (ZOFRAN ODT) 4 MG disintegrating tablet Take 1 tablet (4 mg total) by mouth every 8 (eight) hours as needed for nausea or vomiting. 12/02/17   Kamarius Buckbee A, PA-C  oxyCODONE-acetaminophen (PERCOCET) 10-325 MG tablet Take 1 tablet by mouth every 4 (four) hours as needed for pain.    [provider]  permethrin (ELIMITE) 5 % cream Apply neck down before bed then wash off in  morning. 04/16/17   Mardella LaymanHagler, Brian, MD  SUMAtriptan (IMITREX) 50 MG tablet Take 1 tablet for headache. May take one additional tablet in 2 hours if headache persists or recurs. 12/02/17   Rachid Parham, Coral ElseMia A, PA-C    Family History History reviewed. No pertinent family history.  Social History Social History   Tobacco Use  . Smoking status: Former Smoker    Years: 6.00    Types: Cigarettes    Last attempt to quit: 05/22/2013    Years since  quitting: 4.5  . Smokeless tobacco: Never Used  Substance Use Topics  . Alcohol use: No  . Drug use: No     Allergies   Aloe and Aspirin   Review of Systems Review of Systems  Constitutional: Negative for activity change, chills and fever.  HENT: Positive for congestion. Negative for sinus pressure, sinus pain and sore throat.   Eyes: Negative for visual disturbance.  Respiratory: Negative for shortness of breath.   Cardiovascular: Negative for chest pain.  Gastrointestinal: Positive for nausea and vomiting. Negative for abdominal pain and diarrhea.  Musculoskeletal: Positive for myalgias and neck pain. Negative for arthralgias, back pain and neck stiffness.  Skin: Negative for rash.  Allergic/Immunologic: Negative for immunocompromised state.  Neurological: Positive for headaches. Negative for dizziness, syncope, weakness, light-headedness and numbness.  Psychiatric/Behavioral: Negative for confusion.     Physical Exam Updated Vital Signs BP 101/65   Pulse 84   Ht 5\' 5"  (1.651 m)   Wt 74.8 kg (165 lb)   LMP 11/23/2017 (Exact Date)   SpO2 98%   BMI 27.46 kg/m   Physical Exam  Constitutional: She is oriented to person, place, and time. She appears well-developed and well-nourished.  HENT:  Head: Normocephalic and atraumatic.  Right Ear: Tympanic membrane, external ear and ear canal normal.  Left Ear: Tympanic membrane, external ear and ear canal normal.  Nose: No mucosal edema or rhinorrhea. Right sinus exhibits no maxillary sinus tenderness and no frontal sinus tenderness. Left sinus exhibits no maxillary sinus tenderness and no frontal sinus tenderness.  Mouth/Throat: Uvula is midline, oropharynx is clear and moist and mucous membranes are normal.  She is TTP over the right temporal area, but no focal tenderness over the right temporal artery.   Eyes: Conjunctivae and EOM are normal. Pupils are equal, round, and reactive to light. Right eye exhibits no discharge. Left  eye exhibits no discharge. No scleral icterus.  Neck: Normal range of motion. Neck supple.  No meningeal signs.   Cardiovascular: Normal rate, regular rhythm, normal heart sounds and intact distal pulses. Exam reveals no gallop and no friction rub.  No murmur heard. Pulmonary/Chest: Effort normal and breath sounds normal. No stridor. No respiratory distress. She has no wheezes. She has no rales. She exhibits no tenderness.  Abdominal: Soft. Bowel sounds are normal. She exhibits no distension and no mass. There is no tenderness. There is no rebound and no guarding. No hernia.  Musculoskeletal: Normal range of motion. She exhibits tenderness. She exhibits no edema or deformity.  Tender to palpation over the right trapezius.  No tenderness to palpation to the spinous processes of the cervical, thoracic, or lumbar spine.  No left-sided trapezius tenderness.  Full active and passive range of motion of the neck with rotation, flexion, extension, and lateral flexion.  Pain is worse with rotation of the head to the right.  No overlying erythema, edema, warmth, or ecchymosis.  Neurological: She is alert and oriented to person, place, and time.  Cranial nerves II through XII are grossly intact.  Finger to nose is normal bilaterally.  5 out of 5 strength against resistance of the bilateral upper and lower extremities.  Negative Romberg.  Symmetric tandem gait.  Sensation is intact throughout.  Skin: Capillary refill takes less than 2 seconds.  Nursing note and vitals reviewed.    ED Treatments / Results  Labs (all labs ordered are listed, but only abnormal results are displayed) Labs Reviewed  I-STAT BETA HCG BLOOD, ED ([facility], [facility], [facility] ONLY)    EKG  EKG Interpretation None       Radiology No results found.  Procedures Procedures (including critical care time)  Medications Ordered in ED Medications  ketorolac (TORADOL) 30 MG/ML injection 30 mg (30 mg Intravenous Given 12/02/17 0830)    prochlorperazine (COMPAZINE) injection 10 mg (10 mg Intravenous Given 12/02/17 0830)  diphenhydrAMINE (BENADRYL) injection 25 mg (25 mg Intravenous Given 12/02/17 0828)  sodium chloride 0.9 % bolus 1,000 mL (0 mLs Intravenous Stopped 12/02/17 0921)     Initial Impression / Assessment and Plan / ED Course  I have reviewed the triage vital signs and the nursing notes.  Pertinent labs & imaging results that were available during my care of the patient were reviewed by me and considered in my medical decision making (see chart for details).     Pt HA treated and improved while in ED.  Presentation is like pts typical HA and non concerning for [facility], ICH, Meningitis, or temporal arteritis. Pt is afebrile with no focal neuro deficits, nuchal rigidity, or change in vision. Pt is to follow up with PCP.  She was given an Rx for sumatriptan and and Zofran ODT.  VSS.  NAD.  Strict return precautions given.  Pt verbalizes understanding and is agreeable with plan to dc.   Final Clinical Impressions(s) / ED Diagnoses   Final diagnoses:  Bad headache    ED Discharge Orders        Ordered    SUMAtriptan (IMITREX) 50 MG tablet     12/02/17 0932    ondansetron (ZOFRAN ODT) 4 MG disintegrating tablet  Every 8 hours PRN     12/02/17 0932       Kimyata Milich A, PA-C 12/02/17 4098    Shaune Pollack, MD 12/02/17 [phone]

## 2017-12-02 NOTE — ED Triage Notes (Signed)
Pt from home for headache that has been going on for two days, pt has taken what she normally takes at home and nothing has worked.

## 2018-05-24 DIAGNOSIS — S63599A Other specified sprain of unspecified wrist, initial encounter: Secondary | ICD-10-CM | POA: Insufficient documentation

## 2018-06-08 DIAGNOSIS — Z5189 Encounter for other specified aftercare: Secondary | ICD-10-CM | POA: Insufficient documentation

## 2018-08-21 ENCOUNTER — Encounter: Payer: Self-pay | Admitting: Occupational Therapy

## 2018-08-21 ENCOUNTER — Ambulatory Visit: Payer: Medicaid Other | Attending: Orthopedic Surgery | Admitting: Occupational Therapy

## 2018-08-21 ENCOUNTER — Other Ambulatory Visit: Payer: Self-pay

## 2018-08-21 DIAGNOSIS — M79641 Pain in right hand: Secondary | ICD-10-CM

## 2018-08-21 DIAGNOSIS — M25631 Stiffness of right wrist, not elsewhere classified: Secondary | ICD-10-CM | POA: Diagnosis present

## 2018-08-21 DIAGNOSIS — M6281 Muscle weakness (generalized): Secondary | ICD-10-CM | POA: Diagnosis present

## 2018-08-21 DIAGNOSIS — R278 Other lack of coordination: Secondary | ICD-10-CM | POA: Diagnosis present

## 2018-08-21 DIAGNOSIS — M25531 Pain in right wrist: Secondary | ICD-10-CM | POA: Diagnosis present

## 2018-08-21 DIAGNOSIS — M25641 Stiffness of right hand, not elsewhere classified: Secondary | ICD-10-CM | POA: Diagnosis present

## 2018-08-21 DIAGNOSIS — R208 Other disturbances of skin sensation: Secondary | ICD-10-CM | POA: Diagnosis present

## 2018-08-21 NOTE — Therapy (Signed)
[facility] [address] [place], [place], [address] Phone: [phone]   Fax:  [phone]  Occupational Therapy Evaluation  Patient Details  Name: Alice Wagner MRN: [id number] Date of Birth: April 16, 1981 Referring Provider (OT): Dr. Bradly Bienenstock IV   Encounter Date: 08/21/2018  OT End of Session - 08/21/18 1139    Visit Number  1    Number of Visits  16    Date for OT Re-Evaluation  11/04/18    Authorization Type  Medicaid--awaiting authorization    OT Start Time  1045    OT Stop Time  1130    OT Time Calculation (min)  45 min    Activity Tolerance  Patient limited by pain    Behavior During Therapy  [facility] for tasks assessed/performed       Past Medical History:  Diagnosis Date  . Ganglion of right wrist   . Right wrist pain   . Wears glasses     Past Surgical History:  Procedure Laterality Date  . CESAREAN SECTION  2012;  10-08-2009 ;  01-07-2005  . WRIST ARTHROSCOPY Right 05/29/2015   Procedure: RIGHT WRIST ARTHROSCOPY DEBRIDEMENT, ARTHROSCOPIC GANGLION CYST DECOMPRESSION;  Surgeon: Bradly Bienenstock, MD;  Location: [facility];  Service: Orthopedics;  Laterality: Right;    There were no vitals filed for this visit.  Subjective Assessment - 08/21/18 1057    Subjective   It hurts with everything. i don't know why it's taking so long to bet better.    Pertinent History  R wrist arthroscopy 05/26/18 for R partial scapholunate interosseous ligament tear, R wrist parrtial central TFCC fraying (debridement)  PMH: 2 previous R wrist surgeries 05/2015 for TFCC repair/ganglion cyst, 04/26/16 arthrotomy    Limitations  MD orders include:  AROM, PROM, strengthening, modalities prn    Patient Stated Goals  improve pain and R dominant hand use    Currently in Pain?  Yes    Pain Score  7    3-9/10 at times   Pain Location  Wrist    Pain Orientation  Right   dorsal, radial   Pain Descriptors / Indicators   Aching    Pain Type  Chronic pain    Pain Onset  More than a month ago    Pain Frequency  Constant    Aggravating Factors   use    Pain Relieving Factors  rest, pain meds, splint (wears in public and with use)        [facility] OT Assessment - 08/21/18 0001      Assessment   Medical Diagnosis  R wrist arthroscopy    Referring Provider (OT)  Dr. Bradly Bienenstock IV    Onset Date/Surgical Date  05/24/18    Hand Dominance  Right    Next MD Visit  09/19/18    Prior Therapy  after 2nd surgery (only a few visits)      Precautions   Precautions  None      Balance Screen   Has the patient fallen in the past 6 months  Yes    How many times?  1    slipped in tub     Home  Environment   Family/patient expects to be discharged to:  Private residence    Lives With  --   mother, 3 kids (54, 15, 32 y.o.)     Prior Function   Level of Independence  Independent    Vocation  Part time employment  cleaning homes (worked alone)   [employer]  applied for disability      ADL   Eating/Feeding  --   eating with RUE    Grooming  --   using L hand    Toileting -  Hygiene  --   using LUE   ADL comments  Independent but with pain and/or using LUE as dominant UE.  Kids help with cooking/cleaning, getting out of tub.  Pt also assists with care of grandmother.  Takes frequent breaks with IADLs due to pain.      Mobility   Mobility Status  Independent      Written Expression   Dominant Hand  Right      Observation/Other Assessments   Other Surveys   Select    Quick DASH   81.8%   severe difficulty-unable for most tasks     Sensation   Additional Comments  Pt reports intermittent tingling in 2-3rd digits since surgery      Coordination   9 Hole Peg Test  Right;Left    Right 9 Hole Peg Test  61.72sec   shoulder compensation instead of finger movement     ROM / Strength   AROM / PROM / Strength  AROM      AROM   Overall AROM   Deficits    Overall AROM Comments  wrist flex 10*, ext  to neutral, UD/RD initiates only, approx 60% gross finger flexion, 90% gross finger ext, oppose to 2nd digit with difficulty, unable to oppose to 3rd digit      Hand Function   Right Hand Grip (lbs)  4    Right Hand Lateral Pinch  3 lbs    Right Hand 3 Point Pinch  3 lbs    Left Hand Grip (lbs)  52    Left Hand Lateral Pinch  19 lbs    Left 3 point pinch  16 lbs                      OT Education - 08/21/18 1137    Education Details  Initial HEP for ROM--see pt instructions    Person(s) Educated  Patient    Methods  Explanation;Demonstration;Verbal cues;Handout    Comprehension  Verbalized understanding;Returned demonstration       OT Short Term Goals - 08/21/18 1159      OT SHORT TERM GOAL #1   Title  Pt will be independent with initial HEP for ROM.--check STGs 09/20/18    Baseline  dependent    Status  New      OT SHORT TERM GOAL #2   Title  Pt will demo at least 25* R wrist extension for ADLs.    Baseline  only to neutral    Status  New      OT SHORT TERM GOAL #3   Title  Pt will demo at least 30* R wrist flexion for ADLs.    Baseline  10*    Status  New      OT SHORT TERM GOAL #4   Title  Pt will demo at least 90% gross finger flexion for grasp of objects.    Baseline  60*    Status  New        OT Long Term Goals - 08/21/18 1205      OT LONG TERM GOAL #1   Title  Pt will be independent with strengthening HEP.--check LTGs 11/04/18    Baseline  dependent, no strengthening  HEP    Status  New      OT LONG TERM GOAL #2   Title  Pt will report pain less than or equal to 5/10 with R hand functional use.    Baseline  7-9/10 with use (3/10 with rest)    Status  New      OT LONG TERM GOAL #3   Title  Pt will improve R grip strength to at least 30lbs for opening containers.    Baseline  R-4lbs    Status  New      OT LONG TERM GOAL #4   Title  Pt will demo at least 45* R wrist flex/ext for ADLs/IADLs.    Baseline  wrist flex 10*, extension to  neutral    Status  New      OT LONG TERM GOAL #5   Title  Pt to return to using Rt hand as dominant hand for BADLS     Baseline  using LUE for eating, brushing teeth, tolieting hygiene    Status  New      Long Term Additional Goals   Additional Long Term Goals  Yes      OT LONG TERM GOAL #6   Title  Pt will demo improved coordination, finger ROM as shown by completing 9-hole peg test in less than 35sec.    Baseline  61.72sec    Status  New            Plan - 08/21/18 1140    Clinical Impression Statement  Pt is a 37 y.o. female s/p R wrist arthroscopy (R partial scapholunate interosseous ligament tear, R wrist parrtial central TFCC fraying) on 05/24/18.  Pt with hx of 2 previous R wrist surgeries ( 05/2015  TFCC repair/ganglion cyst, 04/26/16 arthrotomy).  Pt presents today with pain, decr ROM, decr strength, decr sensation, decr coordination, and decr dominant R hand functional use.  Pt would benefit from occupational therapy to address these deficits for improved ADL/IADL performance, decr pain, and incr dominant RUE functional use.     Occupational Profile and client history currently impacting functional performance  Pt reports that she was independent and using RUE as dominant UE for all tasks prior to 05/24/18 surgery (but with pain).  Pt is now using LUE as dominant UE for many ADLs.  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  Pt cares for 3 children and assists in care for ill grandmother.    Occupational performance deficits (Please refer to evaluation for details):  ADL's;IADL's;Work;Leisure;Social Participation    Rehab Potential  Fair    OT Duration  --   eval + 1x week for 3 weeks, followed by 2x week for 6 weeks.  (will request 3 visits over 1 month initially due to Medicaid restrictions)   OT Treatment/Interventions  Self-care/ADL training;Electrical Stimulation;Therapeutic exercise;Patient/family  education;Splinting;Neuromuscular education;Paraffin;Moist Heat;Fluidtherapy;Therapeutic activities;Scar mobilization;Passive range of motion;Manual Therapy;DME and/or AE instruction;Contrast Bath;Ultrasound;Cryotherapy;Energy conservation    Plan  Fludio, Review AROM HEP (add thumb flex/opposition) and progess as able    Clinical Decision Making  Limited treatment options, no task modification necessary    OT Home Exercise Plan  Education provided:  AROM HEP    Consulted and Agree with Plan of Care  Patient       Patient will benefit from skilled therapeutic intervention in order to improve the following deficits and impairments:  Pain, Impaired sensation, Decreased scar mobility, Decreased coordination, Decreased activity  tolerance, Decreased range of motion, Decreased strength, Decreased endurance, Impaired UE functional use  Visit Diagnosis: Pain in right wrist  Stiffness of right wrist, not elsewhere classified  Stiffness of right hand, not elsewhere classified  Pain in right hand  Other lack of coordination  Muscle weakness (generalized)  Other disturbances of skin sensation    Problem List There are no active problems to display for this patient.   [facility] 08/21/2018, 12:17 PM  [facility] [address] [place], [place], [address] Phone: [phone]   Fax:  [phone]  Name: Keegan Bensch MRN: [id number] Date of Birth: Aug 05, 1981   Willa Frater, OTR/L [facility] [address] [place], [place]  [address] [phone] phone [phone] 08/21/18 12:17 PM

## 2018-08-21 NOTE — Patient Instructions (Addendum)
AROM: Wrist Extension   .  With  palm down, bend wrist up. Repeat __10__ times per set.  Do __4__ sessions per day.    AROM: Wrist Flexion   With palm up, bend wrist up. Repeat __10__ times per set.  Do _4___ sessions per day.  Radial / Ulnar Deviation (Assistive)    With palm and wrist flat on table, slide hand side to side like a windshield wiper. Do not move elbow. Repeat 10 times. Do 4 sessions per day.  Copyright  VHI. All rights reserved.     Flexor Tendon Gliding (Active Hook Fist)   With fingers and knuckles straight, bend middle and tip joints. Do not bend large knuckles. Repeat 10 times. Do 4 sessions per day.   Flexor Tendon Gliding (Active Full Fist)   Straighten all fingers, then make a fist, bending all joints. Repeat 10 times. Do 4sessions per day.     MP Flexion (Active Isolated)   Bend ALL fingers at large knuckle, keeping other fingers straight. Do not bend tips. Repeat 10 times. Do 4 sessions per day.

## 2018-08-29 ENCOUNTER — Ambulatory Visit: Payer: Medicaid Other | Admitting: Occupational Therapy

## 2018-08-29 DIAGNOSIS — M25641 Stiffness of right hand, not elsewhere classified: Secondary | ICD-10-CM

## 2018-08-29 DIAGNOSIS — M79641 Pain in right hand: Secondary | ICD-10-CM

## 2018-08-29 DIAGNOSIS — M6281 Muscle weakness (generalized): Secondary | ICD-10-CM

## 2018-08-29 DIAGNOSIS — M25631 Stiffness of right wrist, not elsewhere classified: Secondary | ICD-10-CM

## 2018-08-29 DIAGNOSIS — M25531 Pain in right wrist: Secondary | ICD-10-CM | POA: Diagnosis not present

## 2018-08-29 NOTE — Patient Instructions (Addendum)
  Opposition (Active)   Touch tip of thumb to nail tip of each finger in turn, making an "O" shape. Repeat __10__ times. Do _4-6___ sessions per day.   MP Flexion (Active)   Bend thumb to touch base of little finger, keeping tip joint straight. Repeat __10-15__ times. Do _4-6___ sessions per day.       IP Flexion (Active Blocked)   Brace thumb below tip joint. Bend joint as far as possible. Repeat __10__ times. Do _4-6___ sessions per day.   Composite Extension (Active)   Bring thumb up and out in hitchhiker position.  Repeat __10-15__ times. Do _4-6___ sessions per day. Opposition (Active)   Touch tip of thumb to nail tip of each finger in turn, making an "O" shape. Repeat __10__ times. Do _4-6___ sessions per day.   MP Flexion (Active)   Bend thumb to touch base of little finger, keeping tip joint straight. Repeat __10-15__ times. Do _4-6___ sessions per day.       IP Flexion (Active Blocked)   Brace thumb below tip joint. Bend joint as far as possible. Repeat __10__ times. Do _4-6___ sessions per day.   Composite Extension (Active)   Bring thumb up and out in hitchhiker position.  Repeat __10-15__ times. Do _4-6___ sessions per day.   AROM: Thumb Flexion / Extension    Actively bend right thumb across palm as far as possible. Hold __3__ seconds. Relax. Then pull thumb back into hitchhike position. Repeat _10___ times per set. Do _3___ sessions per day. From "4" to "5" position  Opposition (Active)    Touch tip of thumb to nail tip of each finger in turn, making an "O" shape. Repeat _10___ times. Do _3___ sessions per day.  Palmar Adduction/Abduction (Active)    Move thumb down, away from palm. Move back to rest along palm. Repeat __10__ times. Do __3__ sessions per day.   Walk with 2 lb. Weight in hand (KEEP WRIST NEUTRAL), hold w/ elbow bent then down with big arm swings. Do 2-3 times/day

## 2018-08-29 NOTE — Therapy (Signed)
[facility] [address] [place], [place], [address] Phone: [phone]   Fax:  [phone]  Occupational Therapy Treatment  Patient Details  Name: Alice EtienneShytonya Ericksen MRN: [id number] Date of Birth: 12/17/1980 Referring Provider (OT): Dr. Bradly BienenstockFred Ortmann IV   Encounter Date: 08/29/2018  OT End of Session - 08/29/18 1144    Visit Number  2    Number of Visits  16    Date for OT Re-Evaluation  11/04/18    Authorization Type  Medicaid    Authorization Time Period  approved 3 visits from 08/29/18 - 09/18/18    Authorization - Visit Number  1    Authorization - Number of Visits  3    OT Start Time  0845    OT Stop Time  0930    OT Time Calculation (min)  45 min    Activity Tolerance  Patient limited by pain    Behavior During Therapy  [facility]/performed       Past Medical History:  Diagnosis Date  . Ganglion of right wrist   . Right wrist pain   . Wears glasses     Past Surgical History:  Procedure Laterality Date  . CESAREAN SECTION  2012;  10-08-2009 ;  01-07-2005  . WRIST ARTHROSCOPY Right 05/29/2015   Procedure: RIGHT WRIST ARTHROSCOPY DEBRIDEMENT, ARTHROSCOPIC GANGLION CYST DECOMPRESSION;  Surgeon: Bradly BienenstockFred Ortmann, MD;  [facility];  Service: Orthopedics;  Laterality: Right;    There were no vitals filed for this visit.  Subjective Assessment - 08/29/18 0856    Subjective   I love the heat    Pertinent History  R wrist arthroscopy 05/26/18 for R partial scapholunate interosseous ligament tear, R wrist parrtial central TFCC fraying (debridement)  PMH: 2 previous R wrist surgeries 05/2015 for TFCC repair/ganglion cyst, 04/26/16 arthrotomy    Limitations  MD orders include:  AROM, PROM, strengthening, modalities prn    Patient Stated Goals  improve pain and R dominant hand use    Currently in Pain?  Yes    Pain Score  8     Pain Location  --   and thumb through 3rd digit   Pain  Orientation  Right    Pain Descriptors / Indicators  Aching    Pain Type  Chronic pain    Pain Onset  More than a month ago    Pain Frequency  Constant    Aggravating Factors   use    Pain Relieving Factors  rest, pain meds, splints, heat                   OT Treatments/Exercises (OP) - 08/29/18 0001      Exercises   Exercises  Wrist;Hand      Modalities   Modalities  Fluidotherapy      RUE Fluidotherapy   Number Minutes Fluidotherapy  12 Minutes    RUE Fluidotherapy Location  Hand;Wrist    Comments  at beginning of session for pain relief      Reviewed previously issued HEP for wrist and finger A/ROM. Pt demo each x 10 reps. Issued thumb A/ROM - Pt demo each x 10 reps. Pt also instructed to carry 2 lb weight with wrist neutral for wrist strengthening/stabalization.        OT Education - 08/29/18 0930    Education Details  Reviewed previously issued HEP, issued additonal thumb A/ROM HEP, 1 wrist stabalization ex    Person(s) Educated  Patient    Methods  Explanation;Demonstration;Handout    Comprehension  Verbalized understanding;Returned demonstration       OT Short Term Goals - 08/29/18 1144      OT SHORT TERM GOAL #1   Title  Pt will be independent with initial HEP for ROM.--check STGs 09/20/18    Baseline  dependent    Status  On-going      OT SHORT TERM GOAL #2   Title  Pt will demo at least 25* R wrist extension for ADLs.    Baseline  only to neutral    Status  On-going      OT SHORT TERM GOAL #3   Title  Pt will demo at least 30* R wrist flexion for ADLs.    Baseline  10*    Status  On-going      OT SHORT TERM GOAL #4   Title  Pt will demo at least 90% gross finger flexion for grasp of objects.    Baseline  60*    Status  Achieved        OT Long Term Goals - 08/21/18 1205      OT LONG TERM GOAL #1   Title  Pt will be independent with strengthening HEP.--check LTGs 11/04/18    Baseline  dependent, no strengthening HEP    Status  New       OT LONG TERM GOAL #2   Title  Pt will report pain less than or equal to 5/10 with R hand functional use.    Baseline  7-9/10 with use (3/10 with rest)    Status  New      OT LONG TERM GOAL #3   Title  Pt will improve R grip strength to at least 30lbs for opening containers.    Baseline  R-4lbs    Status  New      OT LONG TERM GOAL #4   Title  Pt will demo at least 45* R wrist flex/ext for ADLs/IADLs.    Baseline  wrist flex 10*, extension to neutral    Status  New      OT LONG TERM GOAL #5   Title  Pt to return to using Rt hand as dominant hand for BADLS     Baseline  using LUE for eating, brushing teeth, tolieting hygiene    Status  New      Long Term Additional Goals   Additional Long Term Goals  Yes      OT LONG TERM GOAL #6   Title  Pt will demo improved coordination, finger ROM as shown by completing 9-hole peg test in less than 35sec.    Baseline  61.72sec    Status  New            Plan - 08/29/18 1145    Clinical Impression Statement  Pt progressing towards STG's. Pt with full composite flexion after fluidotherapy. Pt with improved wrist extension. Wrist flexion still more painful and tight.     Occupational Profile and client history currently impacting functional performance  Pt reports that she was independent and using RUE as dominant UE for all tasks prior to 05/24/18 surgery (but with pain).  Pt is now using LUE as dominant UE for many ADLs.  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  Pt cares for 3 children and assists in care for ill grandmother.    Occupational performance deficits (Please refer  to evaluation for details):  ADL's;IADL's;Work;Leisure;Social Participation    Rehab Potential  Fair    OT Frequency  1x / week    OT Duration  --   3 weeks, followed by 2x/wk for 6 weeks   OT Treatment/Interventions  Self-care/ADL training;Electrical Stimulation;Therapeutic  exercise;Patient/family education;Splinting;Neuromuscular education;Paraffin;Moist Heat;Fluidtherapy;Therapeutic activities;Scar mobilization;Passive range of motion;Manual Therapy;DME and/or AE instruction;Contrast Bath;Ultrasound;Cryotherapy;Energy conservation    Plan  paraffin, review thumb A/ROM HEP prn, begin isometric wrist strengthening and putty ex's    Consulted and Agree with Plan of Care  Patient       Patient will benefit from skilled therapeutic intervention in order to improve the following deficits and impairments:  Pain, Impaired sensation, Decreased scar mobility, Decreased coordination, Decreased activity tolerance, Decreased range of motion, Decreased strength, Decreased endurance, Impaired UE functional use  Visit Diagnosis: Pain in right wrist  Stiffness of right wrist, not elsewhere classified  Stiffness of right hand, not elsewhere classified  Pain in right hand  Muscle weakness (generalized)    Problem List There are no active problems to display for this patient.   Kelli Churn, OTR/L 08/29/2018, 12:53 PM  [facility] [address] [place], [place], [address] Phone: [phone]   Fax:  [phone]  Name: Carsynn Bethune MRN: [id number] Date of Birth: 07/07/1981

## 2018-09-06 ENCOUNTER — Ambulatory Visit: Payer: Medicaid Other | Admitting: Occupational Therapy

## 2018-09-11 ENCOUNTER — Ambulatory Visit: Payer: Medicaid Other | Admitting: Occupational Therapy

## 2018-09-11 DIAGNOSIS — M25641 Stiffness of right hand, not elsewhere classified: Secondary | ICD-10-CM

## 2018-09-11 DIAGNOSIS — M79641 Pain in right hand: Secondary | ICD-10-CM

## 2018-09-11 DIAGNOSIS — M25531 Pain in right wrist: Secondary | ICD-10-CM | POA: Diagnosis not present

## 2018-09-11 DIAGNOSIS — M6281 Muscle weakness (generalized): Secondary | ICD-10-CM

## 2018-09-11 DIAGNOSIS — M25631 Stiffness of right wrist, not elsewhere classified: Secondary | ICD-10-CM

## 2018-09-11 NOTE — Patient Instructions (Signed)
1. Grip Strengthening (Resistive Putty)   Squeeze putty using thumb and all fingers. Repeat _20___ times. Do __2__ sessions per day.   2. Roll putty into tube on table and pinch between first two fingers and thumb x 10 reps each. Do 2 sessions per day     Wrist Extension: Isometric    With right forearm resting palm down on thigh, resist upward movement of hand with other hand. Hold _10___ seconds. Relax. Repeat __5__ times per set. Do __2-3__ sessions per day.  Flexion (Isometric)    With forearm held steady, palm up, use other hand to resist upward movement of hand at wrist. Hold _10___ seconds. Relax. Repeat __5__ times. Do _2-3___ sessions per day.  Wrist Radial Deviation: Isometric    With right forearm resting on thigh, thumb up, use other hand to resist upward movement of hand at wrist. Hold _10___ seconds. Relax. Repeat _10___ times per set.  Do __2-3__ sessions per day.

## 2018-09-11 NOTE — Therapy (Signed)
[facility] [address] [place], [place], [address] Phone: [phone]   Fax:  [phone]  Occupational Therapy Treatment  Patient Details  Name: Alice Wagner MRN: [id number] Date of Birth: 1980-12-06 Referring Provider (OT): Dr. Iran Planas IV   Encounter Date: 09/11/2018  OT End of Session - 09/11/18 0917    Visit Number  3    Number of Visits  16    Date for OT Re-Evaluation  11/04/18    Authorization Type  Medicaid    Authorization Time Period  approved 3 visits from 08/29/18 - 09/18/18    Authorization - Visit Number  2    Authorization - Number of Visits  3    OT Start Time  0845    OT Stop Time  0930    OT Time Calculation (min)  45 min    Activity Tolerance  Patient limited by pain;Patient tolerated treatment well    Behavior During Therapy  [facility] for tasks assessed/performed       Past Medical History:  Diagnosis Date  . Ganglion of right wrist   . Right wrist pain   . Wears glasses     Past Surgical History:  Procedure Laterality Date  . CESAREAN SECTION  2012;  10-08-2009 ;  01-07-2005  . WRIST ARTHROSCOPY Right 05/29/2015   Procedure: RIGHT WRIST ARTHROSCOPY DEBRIDEMENT, ARTHROSCOPIC GANGLION CYST DECOMPRESSION;  Surgeon: Iran Planas, MD;  Location: [place];  Service: Orthopedics;  Laterality: Right;    There were no vitals filed for this visit.  Subjective Assessment - 09/11/18 0855    Pertinent History  R wrist arthroscopy 05/26/18 for R partial scapholunate interosseous ligament tear, R wrist parrtial central TFCC fraying (debridement)  PMH: 2 previous R wrist surgeries 05/2015 for TFCC repair/ganglion cyst, 04/26/16 arthrotomy    Limitations  MD orders include:  AROM, PROM, strengthening, modalities prn    Patient Stated Goals  improve pain and R dominant hand use    Currently in Pain?  Yes    Pain Score  8     Pain Location  --   Thumb and first 2 fingers   Pain  Orientation  Right    Pain Descriptors / Indicators  Aching    Pain Type  Chronic pain    Pain Onset  More than a month ago    Pain Frequency  Constant    Aggravating Factors   use, cold    Pain Relieving Factors  rest, pain meds, splints, heat         [facility] OT Assessment - 09/11/18 0001      AROM   Overall AROM Comments  wrist flex = 25*, ext = 45*      Hand Function   Right Hand Grip (lbs)  5               OT Treatments/Exercises (OP) - 09/11/18 0001      ADLs   ADL Comments  Assessed STG's and progress to date      Exercises   Exercises  Wrist;Hand      Wrist Exercises   Other wrist exercises  Pt issued isometric wrist strengthening HEP - see pt instructions for details. Pt demo each as instructed      Hand Exercises   Other Hand Exercises  Pt issued putty HEP and yellow putty for home use. See pt instructions for details. Pt demo each as instructed      Modalities  Modalities  Paraffin      RUE Paraffin   Number Minutes Paraffin  10 Minutes    RUE Paraffin Location  Hand   and wrist   Comments  at beginning of session to decr stiffness and pain             OT Education - 09/11/18 0913    Education Details  isometric wrist HEP, Putty HEP    Person(s) Educated  Patient    Methods  Explanation;Demonstration;Handout    Comprehension  Verbalized understanding;Returned demonstration       OT Short Term Goals - 09/11/18 0918      OT SHORT TERM GOAL #1   Title  Pt will be independent with initial HEP for ROM.--check STGs 09/20/18    Baseline  dependent    Status  Achieved      OT SHORT TERM GOAL #2   Title  Pt will demo at least 25* R wrist extension for ADLs.    Baseline  eval = only to neutral, 09/11/18 = 45*     Status  Achieved      OT SHORT TERM GOAL #3   Title  Pt will demo at least 30* R wrist flexion for ADLs.    Baseline  eval = 10*, 09/11/18 = 25*     Status  On-going      OT SHORT TERM GOAL #4   Title  Pt will demo at least  90% gross finger flexion for grasp of objects.    Baseline  60*    Status  Achieved        OT Long Term Goals - 09/11/18 0919      OT LONG TERM GOAL #1   Title  Pt will be independent with strengthening HEP.--check LTGs 11/04/18    Baseline  eval = dependent, 09/11/18 - just issued     Status  On-going   09/11/18 - just issued     OT LONG TERM GOAL #2   Title  Pt will report pain less than or equal to 5/10 with R hand functional use.    Baseline  7-9/10 with use (3/10 with rest)    Status  New      OT LONG TERM GOAL #3   Title  Pt will improve R grip strength to at least 30lbs for opening containers.    Baseline  R-4lbs, 09/11/18 = 5 lbs    Status  On-going      OT LONG TERM GOAL #4   Title  Pt will demo at least 45* R wrist flex/ext for ADLs/IADLs.    Baseline  wrist flex 10*, extension to neutral    Status  Partially Met   met in extension (45*)     OT LONG TERM GOAL #5   Title  Pt to return to using Rt hand as dominant hand for BADLS     Baseline  using LUE for eating, brushing teeth, tolieting hygiene    Status  New      OT LONG TERM GOAL #6   Title  Pt will demo improved coordination, finger ROM as shown by completing 9-hole peg test in less than 35sec.    Baseline  61.72sec    Status  New            Plan - 09/11/18 1610    Clinical Impression Statement  Pt has met all STG's except #3. Pt however has improved with wrist flexion ROM. Pt progressing towards LTG's.  Occupational Profile and client history currently impacting functional performance  Pt reports that she was independent and using RUE as dominant UE for all tasks prior to 05/24/18 surgery (but with pain).  Pt is now using LUE as dominant UE for many ADLs.  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  Pt cares for 3 children and assists in care for ill grandmother.    Occupational performance deficits (Please refer to evaluation for  details):  ADL's;IADL's;Work;Leisure;Social Participation    Rehab Potential  Fair    OT Frequency  2x / week    OT Duration  6 weeks    OT Treatment/Interventions  Self-care/ADL training;Electrical Stimulation;Therapeutic exercise;Patient/family education;Splinting;Neuromuscular education;Paraffin;Moist Heat;Fluidtherapy;Therapeutic activities;Scar mobilization;Passive range of motion;Manual Therapy;DME and/or AE instruction;Contrast Bath;Ultrasound;Cryotherapy;Energy conservation    Plan  asking for 2x/wk for 6 more weeks through [facility] to address LTG's.     Consulted and Agree with Plan of Care  Patient       Patient will benefit from skilled therapeutic intervention in order to improve the following deficits and impairments:  Pain, Impaired sensation, Decreased scar mobility, Decreased coordination, Decreased activity tolerance, Decreased range of motion, Decreased strength, Decreased endurance, Impaired UE functional use  Visit Diagnosis: Pain in right wrist  Stiffness of right wrist, not elsewhere classified  Stiffness of right hand, not elsewhere classified  Pain in right hand  Muscle weakness (generalized)    Problem List There are no active problems to display for this patient.   Carey Bullocks, OTR/L 09/11/2018, 10:58 AM  [facility] [address] [place], [place], [address] Phone: [phone]   Fax:  [phone]  Name: Alice Wagner MRN: [id number] Date of Birth: 20-Nov-1980

## 2018-09-26 ENCOUNTER — Ambulatory Visit: Payer: Medicaid Other | Attending: Orthopedic Surgery | Admitting: Occupational Therapy

## 2018-09-26 ENCOUNTER — Telehealth: Payer: Self-pay

## 2018-09-26 DIAGNOSIS — M25531 Pain in right wrist: Secondary | ICD-10-CM | POA: Insufficient documentation

## 2018-09-26 DIAGNOSIS — M25631 Stiffness of right wrist, not elsewhere classified: Secondary | ICD-10-CM | POA: Insufficient documentation

## 2018-09-26 DIAGNOSIS — M6281 Muscle weakness (generalized): Secondary | ICD-10-CM | POA: Insufficient documentation

## 2018-09-26 DIAGNOSIS — M79641 Pain in right hand: Secondary | ICD-10-CM | POA: Insufficient documentation

## 2018-09-26 DIAGNOSIS — M25641 Stiffness of right hand, not elsewhere classified: Secondary | ICD-10-CM | POA: Insufficient documentation

## 2018-09-26 DIAGNOSIS — R278 Other lack of coordination: Secondary | ICD-10-CM | POA: Insufficient documentation

## 2018-09-26 NOTE — Telephone Encounter (Signed)
Called patient and left message re: missed O.T. Appointment today. Reminded patient of next appointment date/time and to call back with any questions or to reschedule today's missed appointment. Left call back number

## 2018-10-02 ENCOUNTER — Ambulatory Visit: Payer: Medicaid Other | Admitting: Occupational Therapy

## 2018-10-04 ENCOUNTER — Ambulatory Visit: Payer: Medicaid Other | Admitting: Occupational Therapy

## 2018-10-09 ENCOUNTER — Ambulatory Visit: Payer: Medicaid Other | Admitting: Occupational Therapy

## 2018-10-12 ENCOUNTER — Ambulatory Visit: Payer: Medicaid Other | Admitting: Occupational Therapy

## 2018-10-12 DIAGNOSIS — M25641 Stiffness of right hand, not elsewhere classified: Secondary | ICD-10-CM

## 2018-10-12 DIAGNOSIS — M25631 Stiffness of right wrist, not elsewhere classified: Secondary | ICD-10-CM

## 2018-10-12 DIAGNOSIS — R278 Other lack of coordination: Secondary | ICD-10-CM | POA: Diagnosis present

## 2018-10-12 DIAGNOSIS — M79641 Pain in right hand: Secondary | ICD-10-CM

## 2018-10-12 DIAGNOSIS — M6281 Muscle weakness (generalized): Secondary | ICD-10-CM | POA: Diagnosis present

## 2018-10-12 DIAGNOSIS — M25531 Pain in right wrist: Secondary | ICD-10-CM

## 2018-10-12 NOTE — Therapy (Signed)
[facility] [address] [place], [place], [address] Phone: [phone]   Fax:  [phone]  Occupational Therapy Treatment  Patient Details  Name: Alice Wagner MRN: [id number] Date of Birth: 1981/02/18 Referring Provider (OT): Dr. Iran Planas IV   Encounter Date: 10/12/2018  OT End of Session - 10/12/18 0924    Visit Number  4    Number of Visits  16    Authorization Time Period  approved 6 visits 12/10-12/30    Authorization - Visit Number  3    Authorization - Number of Visits  9    OT Start Time  0846    OT Stop Time  0930    OT Time Calculation (min)  44 min    Activity Tolerance  Patient limited by pain;Patient tolerated treatment well    Behavior During Therapy  [facility] for tasks assessed/performed       Past Medical History:  Diagnosis Date  . Ganglion of right wrist   . Right wrist pain   . Wears glasses     Past Surgical History:  Procedure Laterality Date  . CESAREAN SECTION  2012;  10-08-2009 ;  01-07-2005  . WRIST ARTHROSCOPY Right 05/29/2015   Procedure: RIGHT WRIST ARTHROSCOPY DEBRIDEMENT, ARTHROSCOPIC GANGLION CYST DECOMPRESSION;  Surgeon: Iran Planas, MD;  Location: [facility];  Service: Orthopedics;  Laterality: Right;    There were no vitals filed for this visit.  Subjective Assessment - 10/12/18 0854    Subjective   I couldn't even take my shower    Pertinent History  R wrist arthroscopy 05/26/18 for R partial scapholunate interosseous ligament tear, R wrist parrtial central TFCC fraying (debridement)  PMH: 2 previous R wrist surgeries 05/2015 for TFCC repair/ganglion cyst, 04/26/16 arthrotomy    Patient Stated Goals  improve pain and R dominant hand use    Currently in Pain?  Yes    Pain Score  10-Worst pain ever    Pain Location  Wrist    Pain Orientation  Right    Pain Descriptors / Indicators  Aching    Pain Type  Chronic pain    Pain Onset  More than a month ago     Pain Frequency  Constant    Aggravating Factors   cold temps    Pain Relieving Factors  rest, heat              Treatment: Fluidotherapy x 10 mins to RUE for pain relief, no adverse reactions. A/ROM wrist flexion extension and supination/ pronation followed by review of isometric HEP. Ambulating with 2 lbs weight in right hand 5 laps around gym Upgraded putty HEP to red, pt returned demonstration. Arm bike x 5 mins level1 for conditioning.             OT Education - 10/12/18 [id number]    Education Details  reviewed isometric wrist HEP, upgraded putty HEP to red for grip and tip pinch, 10-20 reps    Person(s) Educated  Patient    Methods  Explanation;Demonstration;Handout    Comprehension  Verbalized understanding;Returned demonstration;Verbal cues required       OT Short Term Goals - 09/11/18 1749      OT SHORT TERM GOAL #1   Title  Pt will be independent with initial HEP for ROM.--check STGs 09/20/18    Baseline  dependent    Status  Achieved      OT SHORT TERM GOAL #2   Title  Pt will demo  at least 25* R wrist extension for ADLs.    Baseline  eval = only to neutral, 09/11/18 = 45*     Status  Achieved      OT SHORT TERM GOAL #3   Title  Pt will demo at least 30* R wrist flexion for ADLs.    Baseline  eval = 10*, 09/11/18 = 25*     Status  On-going      OT SHORT TERM GOAL #4   Title  Pt will demo at least 90% gross finger flexion for grasp of objects.    Baseline  60*    Status  Achieved        OT Long Term Goals - 09/11/18 0919      OT LONG TERM GOAL #1   Title  Pt will be independent with strengthening HEP.--check LTGs 11/04/18    Baseline  eval = dependent, 09/11/18 - just issued     Status  On-going   09/11/18 - just issued     OT LONG TERM GOAL #2   Title  Pt will report pain less than or equal to 5/10 with R hand functional use.    Baseline  7-9/10 with use (3/10 with rest)    Status  New      OT LONG TERM GOAL #3   Title  Pt will  improve R grip strength to at least 30lbs for opening containers.    Baseline  R-4lbs, 09/11/18 = 5 lbs    Status  On-going      OT LONG TERM GOAL #4   Title  Pt will demo at least 45* R wrist flex/ext for ADLs/IADLs.    Baseline  wrist flex 10*, extension to neutral    Status  Partially Met   met in extension (45*)     OT LONG TERM GOAL #5   Title  Pt to return to using Rt hand as dominant hand for BADLS     Baseline  using LUE for eating, brushing teeth, tolieting hygiene    Status  New      OT LONG TERM GOAL #6   Title  Pt will demo improved coordination, finger ROM as shown by completing 9-hole peg test in less than 35sec.    Baseline  61.72sec    Status  New            Plan - 10/12/18 0854    Clinical Impression Statement  Pt is progressing towards goals however pt progress has been impeded due to pt illness and inability to attend therapy as a result.    Occupational Profile and client history currently impacting functional performance  Pt reports that she was independent and using RUE as dominant UE for all tasks prior to 05/24/18 surgery (but with pain).  Pt is now using LUE as dominant UE for many ADLs.  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  Pt cares for 3 children and assists in care for ill grandmother.    Occupational performance deficits (Please refer to evaluation for details):  ADL's;IADL's;Work;Leisure;Social Participation    Rehab Potential  Fair    OT Frequency  2x / week    OT Duration  6 weeks    Plan  check goals and request more visits for New Year, work towards [employer] long term goals    Consulted and Agree with Plan of Care  Patient  Patient will benefit from skilled therapeutic intervention in order to improve the following deficits and impairments:  Pain, Impaired sensation, Decreased scar mobility, Decreased coordination, Decreased activity tolerance, Decreased range of motion,  Decreased strength, Decreased endurance, Impaired UE functional use  Visit Diagnosis: Pain in right wrist  Stiffness of right wrist, not elsewhere classified  Stiffness of right hand, not elsewhere classified  Pain in right hand  Muscle weakness (generalized)    Problem List There are no active problems to display for this patient.   RINE,KATHRYN 10/12/2018, 9:29 AM  [facility] [address] [place], [place], [address] Phone: [phone]   Fax:  [phone]  Name: Alice Wagner MRN: [id number] Date of Birth: 1981/01/06

## 2018-10-13 ENCOUNTER — Encounter: Payer: Medicaid Other | Admitting: Occupational Therapy

## 2018-10-13 ENCOUNTER — Ambulatory Visit: Payer: Medicaid Other | Admitting: Occupational Therapy

## 2018-10-13 DIAGNOSIS — M25531 Pain in right wrist: Secondary | ICD-10-CM

## 2018-10-13 DIAGNOSIS — M25631 Stiffness of right wrist, not elsewhere classified: Secondary | ICD-10-CM

## 2018-10-13 DIAGNOSIS — R278 Other lack of coordination: Secondary | ICD-10-CM

## 2018-10-13 DIAGNOSIS — M79641 Pain in right hand: Secondary | ICD-10-CM

## 2018-10-13 DIAGNOSIS — M25641 Stiffness of right hand, not elsewhere classified: Secondary | ICD-10-CM

## 2018-10-13 DIAGNOSIS — M6281 Muscle weakness (generalized): Secondary | ICD-10-CM

## 2018-10-13 NOTE — Therapy (Signed)
[facility] [address] [place], [place], [address] Phone: [phone]   Fax:  [phone]  Occupational Therapy Treatment  Patient Details  Name: Alice Wagner MRN: [id number] Date of Birth: 11/02/1980 Referring Provider (OT): Dr. Iran Planas IV   Encounter Date: 10/13/2018  OT End of Session - 10/13/18 1440    Visit Number  5    Number of Visits  16    Date for OT Re-Evaluation  11/04/18    Authorization Type  Medicaid    Authorization Time Period  approved 6 visits 12/10-12/30    Authorization - Visit Number  4    Authorization - Number of Visits  9    OT Start Time  4827    OT Stop Time  0786    OT Time Calculation (min)  40 min    Activity Tolerance  Patient limited by pain;Patient tolerated treatment well    Behavior During Therapy  [facility] for tasks assessed/performed       Past Medical History:  Diagnosis Date  . Ganglion of right wrist   . Right wrist pain   . Wears glasses     Past Surgical History:  Procedure Laterality Date  . CESAREAN SECTION  2012;  10-08-2009 ;  01-07-2005  . WRIST ARTHROSCOPY Right 05/29/2015   Procedure: RIGHT WRIST ARTHROSCOPY DEBRIDEMENT, ARTHROSCOPIC GANGLION CYST DECOMPRESSION;  Surgeon: Iran Planas, MD;  Location: [facility];  Service: Orthopedics;  Laterality: Right;    There were no vitals filed for this visit.  Subjective Assessment - 10/13/18 1410    Subjective   I did my girls hair which increased the pain. 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  Yes    Pain Score  7     Pain Location  Wrist    Pain Orientation  Right    Pain Descriptors / Indicators  Aching    Pain Type  Chronic pain    Pain Onset  More than a month ago    Aggravating Factors   cold temps    Pain Relieving Factors  rest, heat         [facility] OT Assessment - 10/13/18 0001      Coordination   Right 9 Hole Peg Test  23 sec      AROM   Overall AROM Comments  wrist flex = 45*, ext = 50*       Hand Function   Right Hand Grip (lbs)  28 lbs               OT Treatments/Exercises (OP) - 10/13/18 0001      ADLs   ADL Comments  Assessed remaining STG and progress towards LTG's (see assessment and goal section for details)      Wrist Exercises   Other wrist exercises  A/ROM in wrist flex, ext, and RD x 10 reps each, followed by isometric wrist ex's in each position x 5 reps each holding 10 sec.  Other wrist exercises  Issued light wrist strengthening HEP with 1 lb. weight - see pt instructions for details. (to begin with 2 lb weight in 1 week in no problems)       Hand Exercises   Other Hand Exercises  Gripper set at level 1 resistance to pick up blocks for sustained grip strength Rt hand.       RUE Fluidotherapy   Number Minutes Fluidotherapy  12 Minutes    RUE Fluidotherapy Location  Hand;Wrist    Comments  at beginning of session for pain relief             OT Education - 10/13/18 1439    Education Details  Updated wrist strengthening HEP     Person(s) Educated  Patient    Methods  Explanation;Demonstration;Handout    Comprehension  Verbalized understanding;Returned demonstration       OT Short Term Goals - 10/13/18 1440      OT SHORT TERM GOAL #1   Title  Pt will be independent with initial HEP for ROM.--check STGs 09/20/18    Baseline  dependent    Status  Achieved      OT SHORT TERM GOAL #2   Title  Pt will demo at least 25* R wrist extension for ADLs.    Baseline  eval = only to neutral,  09/11/18 = 45*     Status  Achieved   10/13/18: wrist extension = 50*     OT SHORT TERM GOAL #3   Title  Pt will demo at least 30* R wrist flexion for ADLs.    Baseline  eval = 10*, 09/11/18 = 25*     Status  Achieved   10/13/18: Rt wrist flex = 45*      OT SHORT TERM GOAL #4   Title  Pt will demo at least 90% gross finger flexion for grasp of objects.    Baseline  60*    Status  Achieved        OT Long Term Goals - 10/13/18 1441      OT LONG TERM GOAL #1   Title  Pt will be independent with strengthening HEP.--check LTGs 11/04/18    Baseline  eval = dependent, 09/11/18 - just issued     Status  Partially Met   will need upgrades     OT LONG TERM GOAL #2   Title  Pt will report pain less than or equal to 5/10 with R hand functional use.    Baseline  7-9/10 with use (3/10 with rest)    Status  On-going      OT LONG TERM GOAL #3   Title  Pt will improve R grip strength to at least 30lbs for opening containers.    Baseline  R-4lbs, 09/11/18 = 5 lbs    Status  On-going   10/13/18: Rt = 28 lbs     OT LONG TERM GOAL #4   Title  Pt will demo at least 45* R wrist flex/ext for ADLs/IADLs.    Baseline  wrist flex 10*, extension to neutral    Status  Achieved   flex = 45*, ext = 50*     OT LONG TERM GOAL #5   Title  Pt to return to using Rt hand as dominant hand for BADLS     Baseline  using LUE for eating, brushing teeth, tolieting hygiene    Status  Partially Met   As of 10/13/18: eating 40% with Rt hand,  brushing teeth with Rt hand, still wiping with Lt hand     OT LONG TERM GOAL #6   Title  Pt will demo improved coordination, finger ROM as shown by completing 9-hole peg test in less than 35sec.    Baseline  61.72sec    Status  Achieved   10/13/18:  23 sec.            Plan - 10/13/18 1443    Clinical Impression Statement  Pt has met all STG's and some LTG's. Pt has improved significantly with RT wrist ROM and coordination. Pt also continues to make progress with  hand and wrist strength and increased functional use of Rt hand. Renewal today to cover visits in 2020.     Occupational Profile and client history currently impacting functional performance  Pt reports that she was independent and using RUE as dominant UE for all tasks prior to 05/24/18 surgery (but with pain).  Pt is now using LUE as dominant UE for many ADLs.  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  Pt cares for 3 children and assists in care for ill grandmother.    Occupational performance deficits (Please refer to evaluation for details):  ADL's;IADL's;Work;Leisure;Social Participation    Rehab Potential  Good    OT Frequency  1x / week    OT Duration  --   for 3 weeks (beginning Jan 2020), then 2x/wk for 4 weeks prn   OT Treatment/Interventions  Self-care/ADL training;Electrical Stimulation;Therapeutic exercise;Patient/family education;Splinting;Neuromuscular education;Paraffin;Moist Heat;Fluidtherapy;Therapeutic activities;Scar mobilization;Passive range of motion;Manual Therapy;DME and/or AE instruction;Contrast Bath;Ultrasound;Cryotherapy;Energy conservation    Plan  continue progress towards strength Rt hand and wrist    Consulted and Agree with Plan of Care  Patient       Patient will benefit from skilled therapeutic intervention in order to improve the following deficits and impairments:  Pain, Impaired sensation, Decreased scar mobility, Decreased coordination, Decreased activity tolerance, Decreased range of motion, Decreased strength, Decreased endurance, Impaired UE functional use  Visit Diagnosis: Pain in right wrist  Stiffness of right wrist, not elsewhere classified  Stiffness of right hand, not elsewhere classified  Pain in right hand  Muscle weakness (generalized)  Other lack of coordination    Problem List There are no active problems to display for this patient.   Carey Bullocks,  OTR/L 10/13/2018, 3:45 PM  [facility] [address] [place], [place], [address] Phone: [phone]   Fax:  [phone]  Name: Contessa Preuss MRN: [id number] Date of Birth: 15-Jan-1981

## 2018-10-13 NOTE — Patient Instructions (Signed)
Wrist Extension: Resisted    With right palm down, _1___ pound weight in hand, bend wrist up. Return slowly. Repeat __10__ times per set. Do __1__ sets per session. Do __3__ sessions per day.   Wrist Radial Deviation: Resisted    With right thumb up, __1__ pound weight in hand, bend wrist up. Return slowly. Repeat __10__ times per set. Do __1__ sets per session. Do __3__ sessions per day.   Flexion (Resistive)    With hand palm-up and holding __1__ pound weight, bend hand toward you at wrist. Hold __2__ seconds. Relax slowly. Repeat __10__ times. Do __3__ sessions per day.

## 2018-10-16 ENCOUNTER — Encounter: Payer: Medicaid Other | Admitting: Occupational Therapy

## 2018-10-16 ENCOUNTER — Ambulatory Visit: Payer: Medicaid Other | Admitting: Occupational Therapy

## 2018-10-16 DIAGNOSIS — M25531 Pain in right wrist: Secondary | ICD-10-CM

## 2018-10-16 DIAGNOSIS — M25631 Stiffness of right wrist, not elsewhere classified: Secondary | ICD-10-CM

## 2018-10-16 DIAGNOSIS — M6281 Muscle weakness (generalized): Secondary | ICD-10-CM

## 2018-10-16 NOTE — Therapy (Signed)
as dominant UE for many ADLs.  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  Pt cares for 3 children and assists in care for ill grandmother.    Occupational performance deficits (Please refer to evaluation for details):  ADL's;IADL's;Work;Leisure;Social Participation    Rehab Potential  Good    OT Frequency  1x / week    OT Duration  --   for 3 weeks (beginning in 2020), then 2x/wk  for 4 weeks prn   OT Treatment/Interventions  Self-care/ADL training;Electrical Stimulation;Therapeutic exercise;Patient/family education;Splinting;Neuromuscular education;Paraffin;Moist Heat;Fluidtherapy;Therapeutic activities;Scar mobilization;Passive range of motion;Manual Therapy;DME and/or AE instruction;Contrast Bath;Ultrasound;Cryotherapy;Energy conservation    Plan  continue progress towards strength Rt hand and wrist, check on MCD authorization for 2020    Consulted and Agree with Plan of Care  Patient       Patient will benefit from skilled therapeutic intervention in order to improve the following deficits and impairments:  Pain, Impaired sensation, Decreased scar mobility, Decreased coordination, Decreased activity tolerance, Decreased range of motion, Decreased strength, Decreased endurance, Impaired UE functional use  Visit Diagnosis: Pain in right wrist  Stiffness of right wrist, not elsewhere classified  Muscle weakness (generalized)    Problem List There are no active problems to display for this patient.   ,  Johnson, OTR/L 10/16/2018, 10:09 AM  [facility] [address] [place], [place], [address] Phone: [phone]   Fax:  [phone]  Name: Alice Wagner MRN: [id number] Date of Birth: 07/13/1981   [facility] [address] [place], [place], [address] Phone: [phone]   Fax:  [phone]  Occupational Therapy Treatment  Patient Details  Name: Alice Wagner MRN: [id number] Date of Birth: 09/23/1981 Referring Provider (OT): Dr. Fred Ortmann IV   Encounter Date: 10/16/2018  OT End of Session - 10/16/18 1005    Visit Number  6    Number of Visits  16    Date for OT Re-Evaluation  11/04/18    Authorization Type  Medicaid    Authorization Time Period  approved 6 visits 12/10-12/30    Authorization - Visit Number  5    Authorization - Number of Visits  9    OT Start Time  0930    OT Stop Time  1015    OT Time Calculation (min)  45 min    Activity Tolerance  Patient limited by pain;Patient tolerated treatment well    Behavior During Therapy  [facility] for tasks assessed/performed       Past Medical History:  Diagnosis Date  . Ganglion of right wrist   . Right wrist pain   . Wears glasses     Past Surgical History:  Procedure Laterality Date  . CESAREAN SECTION  2012;  10-08-2009 ;  01-07-2005  . WRIST ARTHROSCOPY Right 05/29/2015   Procedure: RIGHT WRIST ARTHROSCOPY DEBRIDEMENT, ARTHROSCOPIC GANGLION CYST DECOMPRESSION;  Surgeon: Fred Ortmann, MD;  Location: [facility];  Service: Orthopedics;  Laterality: Right;    There were no vitals filed for this visit.  Subjective Assessment - 10/16/18 0934    Subjective   It feels a little better today    Pertinent History  R wrist arthroscopy 05/26/18 for R partial scapholunate interosseous ligament tear, R wrist parrtial central TFCC fraying (debridement)  PMH: 2 previous R wrist surgeries 05/2015 for TFCC repair/ganglion cyst, 04/26/16 arthrotomy    Limitations  MD told pt not to lift more than 5 lbs at work    Patient Stated Goals  improve pain and R dominant hand use    Currently in Pain?  Yes    Pain Score  6     Pain Location  Wrist    Pain  Orientation  Right    Pain Descriptors / Indicators  Aching    Pain Type  Chronic pain    Pain Onset  More than a month ago    Pain Frequency  Constant    Aggravating Factors   cold temps    Pain Relieving Factors  rest, heat                   OT Treatments/Exercises (OP) - 10/16/18 0001      Exercises   Exercises  --   UBE x 5 min. to prevent RUE stiffness     Wrist Exercises   Other wrist exercises  wrist strengthening in extension, RD, and flexion with 1 lb. weight each x 10 reps, 2 sets    Other wrist exercises  Walking w/ 3 lb weight Rt hand for isometric wrist strengthening (arm down and with bent elbow). Wrist winder x 2 revolutions w/ no weight      Hand Exercises   Other Hand Exercises  Gripper set at level 1 resistance to pick up blocks for sustained grip strength Rt hand.       RUE Fluidotherapy   Number Minutes Fluidotherapy  12 Minutes    RUE Fluidotherapy Location  Hand;Wrist    Comments    as dominant UE for many ADLs.  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  Pt cares for 3 children and assists in care for ill grandmother.    Occupational performance deficits (Please refer to evaluation for details):  ADL's;IADL's;Work;Leisure;Social Participation    Rehab Potential  Good    OT Frequency  1x / week    OT Duration  --   for 3 weeks (beginning in 2020), then 2x/wk  for 4 weeks prn   OT Treatment/Interventions  Self-care/ADL training;Electrical Stimulation;Therapeutic exercise;Patient/family education;Splinting;Neuromuscular education;Paraffin;Moist Heat;Fluidtherapy;Therapeutic activities;Scar mobilization;Passive range of motion;Manual Therapy;DME and/or AE instruction;Contrast Bath;Ultrasound;Cryotherapy;Energy conservation    Plan  continue progress towards strength Rt hand and wrist, check on MCD authorization for 2020    Consulted and Agree with Plan of Care  Patient       Patient will benefit from skilled therapeutic intervention in order to improve the following deficits and impairments:  Pain, Impaired sensation, Decreased scar mobility, Decreased coordination, Decreased activity tolerance, Decreased range of motion, Decreased strength, Decreased endurance, Impaired UE functional use  Visit Diagnosis: Pain in right wrist  Stiffness of right wrist, not elsewhere classified  Muscle weakness (generalized)    Problem List There are no active problems to display for this patient.   Carey Bullocks, OTR/L 10/16/2018, 10:09 AM  [facility] [address] [place], [place], [address] Phone: [phone]   Fax:  [phone]  Name: Alice Wagner MRN: [id number] Date of Birth: Nov 06, 1980

## 2018-10-30 ENCOUNTER — Ambulatory Visit: Payer: Medicaid Other | Attending: Orthopedic Surgery | Admitting: Occupational Therapy

## 2018-10-30 DIAGNOSIS — M79641 Pain in right hand: Secondary | ICD-10-CM

## 2018-10-30 DIAGNOSIS — M25631 Stiffness of right wrist, not elsewhere classified: Secondary | ICD-10-CM | POA: Insufficient documentation

## 2018-10-30 DIAGNOSIS — M25641 Stiffness of right hand, not elsewhere classified: Secondary | ICD-10-CM

## 2018-10-30 DIAGNOSIS — M25531 Pain in right wrist: Secondary | ICD-10-CM | POA: Diagnosis not present

## 2018-10-30 DIAGNOSIS — M6281 Muscle weakness (generalized): Secondary | ICD-10-CM | POA: Insufficient documentation

## 2018-10-30 NOTE — Therapy (Signed)
[facility] [address] [place], [place], [address] Phone: [phone]   Fax:  [phone]  Occupational Therapy Treatment  Patient Details  Name: Alice Wagner MRN: [id number] Date of Birth: [birth year] Referring Provider (OT): Dr. Iran Planas IV   Encounter Date: 10/30/2018  OT End of Session - 10/30/18 1307    Visit Number  7    Number of Visits  16    Date for OT Re-Evaluation  11/15/18    Authorization Type  Medicaid    Authorization Time Period  approved 6 visits 12/10-12/30/19, approved 3 visits 10/26/18 - 11/15/18     Authorization - Visit Number  1    Authorization - Number of Visits  3    OT Start Time  2025    OT Stop Time  1310    OT Time Calculation (min)  40 min    Activity Tolerance  Patient limited by pain;Patient tolerated treatment well    Behavior During Therapy  [facility] for tasks assessed/performed       Past Medical History:  Diagnosis Date  . Ganglion of right wrist   . Right wrist pain   . Wears glasses     Past Surgical History:  Procedure Laterality Date  . CESAREAN SECTION  2012;  10-08-2009 ;  01-07-2005  . WRIST ARTHROSCOPY Right 05/29/2015   Procedure: RIGHT WRIST ARTHROSCOPY DEBRIDEMENT, ARTHROSCOPIC GANGLION CYST DECOMPRESSION;  Surgeon: Iran Planas, MD;  Location: [facility];  Service: Orthopedics;  Laterality: Right;    There were no vitals filed for this visit.  Subjective Assessment - 10/30/18 1232    Subjective   My hand had some numbness yesterday but didn't last long (denies numbness anywhere else)     Pertinent History  R wrist arthroscopy 05/26/18 for R partial scapholunate interosseous ligament tear, R wrist parrtial central TFCC fraying (debridement)  PMH: 2 previous R wrist surgeries 05/2015 for TFCC repair/ganglion cyst, 04/26/16 arthrotomy    Limitations  MD told pt not to lift more than 5 lbs at work    Patient Stated Goals  improve pain and R dominant  hand use    Currently in Pain?  Yes    Pain Score  10-Worst pain ever    Pain Location  Wrist   and hand   Pain Orientation  Right    Pain Descriptors / Indicators  Aching    Pain Type  Chronic pain    Pain Onset  More than a month ago    Pain Frequency  Constant    Aggravating Factors   rainy weather    Pain Relieving Factors  rest, heat                   OT Treatments/Exercises (OP) - 10/30/18 0001      Exercises   Exercises  --   UBE x 8 min. to prevent RUE stiffness     Wrist Exercises   Other wrist exercises  wrist strengthening in extension, RD, and flexion with 2 lb. weight each x 10 reps, 2 sets. Wrist winder x 2 revolutions w/ 1 lb. weight    Other wrist exercises  Walking w/ 3 lb weight Rt hand for isometric wrist strengthening (arm down and with bent elbow).       Hand Exercises   Other Hand Exercises  Gripper set at level 2 resistance to pick up blocks for sustained grip strength Rt hand.       RUE  Fluidotherapy   Number Minutes Fluidotherapy  12 Minutes    RUE Fluidotherapy Location  Hand;Wrist    Comments  at beginning of session               OT Short Term Goals - 10/13/18 1440      OT SHORT TERM GOAL #1   Title  Pt will be independent with initial HEP for ROM.--check STGs 09/20/18    Baseline  dependent    Status  Achieved      OT SHORT TERM GOAL #2   Title  Pt will demo at least 25* R wrist extension for ADLs.    Baseline  eval = only to neutral, 09/11/18 = 45*     Status  Achieved   10/13/18: wrist extension = 50*     OT SHORT TERM GOAL #3   Title  Pt will demo at least 30* R wrist flexion for ADLs.    Baseline  eval = 10*, 09/11/18 = 25*     Status  Achieved   10/13/18: Rt wrist flex = 45*      OT SHORT TERM GOAL #4   Title  Pt will demo at least 90% gross finger flexion for grasp of objects.    Baseline  60*    Status  Achieved        OT Long Term Goals - 10/13/18 1441      OT LONG TERM GOAL #1   Title  Pt will be  independent with strengthening HEP.--check LTGs 11/04/18    Baseline  eval = dependent, 09/11/18 - just issued     Status  Partially Met   will need upgrades     OT LONG TERM GOAL #2   Title  Pt will report pain less than or equal to 5/10 with R hand functional use.    Baseline  7-9/10 with use (3/10 with rest)    Status  On-going      OT LONG TERM GOAL #3   Title  Pt will improve R grip strength to at least 30lbs for opening containers.    Baseline  R-4lbs, 09/11/18 = 5 lbs    Status  On-going   10/13/18: Rt = 28 lbs     OT LONG TERM GOAL #4   Title  Pt will demo at least 45* R wrist flex/ext for ADLs/IADLs.    Baseline  wrist flex 10*, extension to neutral    Status  Achieved   flex = 45*, ext = 50*     OT LONG TERM GOAL #5   Title  Pt to return to using Rt hand as dominant hand for BADLS     Baseline  using LUE for eating, brushing teeth, tolieting hygiene    Status  Partially Met   As of 10/13/18: eating 40% with Rt hand, brushing teeth with Rt hand, still wiping with Lt hand     OT LONG TERM GOAL #6   Title  Pt will demo improved coordination, finger ROM as shown by completing 9-hole peg test in less than 35sec.    Baseline  61.72sec    Status  Achieved   10/13/18:  23 sec.            Plan - 10/30/18 1308    Clinical Impression Statement  Pt progressing with Rt hand/wrist function and strength. Pt with increased pain today which pt contributes partly to weather    Occupational Profile and client history currently impacting functional performance  Pt reports that she was independent and using RUE as dominant UE for all tasks prior to 05/24/18 surgery (but with pain).  Pt is now using LUE as dominant UE for many ADLs.  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  Pt cares for 3 children and assists in care for ill grandmother.    Occupational performance deficits (Please refer to evaluation for details):   ADL's;IADL's;Work;Leisure;Social Participation    Rehab Potential  Good    OT Frequency  1x / week    OT Duration  --   for 3 weeks, then 2x/wk for 4 weeks prn   OT Treatment/Interventions  Self-care/ADL training;Electrical Stimulation;Therapeutic exercise;Patient/family education;Splinting;Neuromuscular education;Paraffin;Moist Heat;Fluidtherapy;Therapeutic activities;Scar mobilization;Passive range of motion;Manual Therapy;DME and/or AE instruction;Contrast Bath;Ultrasound;Cryotherapy;Energy conservation    Plan  paraffin, continue wrist and hand strengthening    Consulted and Agree with Plan of Care  Patient       Patient will benefit from skilled therapeutic intervention in order to improve the following deficits and impairments:  Pain, Impaired sensation, Decreased scar mobility, Decreased coordination, Decreased activity tolerance, Decreased range of motion, Decreased strength, Decreased endurance, Impaired UE functional use  Visit Diagnosis: Pain in right wrist  Stiffness of right wrist, not elsewhere classified  Muscle weakness (generalized)  Stiffness of right hand, not elsewhere classified  Pain in right hand    Problem List There are no active problems to display for this patient.   Carey Bullocks, OTR/L 10/30/2018, 1:10 PM  [facility] [address] [place], [place], [address] Phone: [phone]   Fax:  [phone]  Name: Alice Wagner MRN: [id number] Date of Birth: 02/02/1981

## 2018-11-06 ENCOUNTER — Ambulatory Visit
Admission: EM | Admit: 2018-11-06 | Discharge: 2018-11-06 | Disposition: A | Discharge status: Home / Self Care | Payer: Medicaid Other | Attending: Physician Assistant | Admitting: Physician Assistant

## 2018-11-06 DIAGNOSIS — H6690 Otitis media, unspecified, unspecified ear: Secondary | ICD-10-CM | POA: Diagnosis not present

## 2018-11-06 MED ORDER — NEOMYCIN-POLYMYXIN-HC 3.5-10000-1 OT SOLN: 4.0000 [drp] | Freq: Four times a day (QID) | OTIC | 0 refills | Status: AC

## 2018-11-06 MED ORDER — AMOXICILLIN 500 MG PO CAPS: 500.0000 mg | ORAL_CAPSULE | Freq: Three times a day (TID) | ORAL | 0 refills | Status: DC

## 2018-11-06 NOTE — ED Triage Notes (Signed)
Patient complains of left ear pain and itching sensation X 2 days. Patient denies other symptoms but does note a mild "scratchy throat".

## 2018-11-06 NOTE — Discharge Instructions (Signed)
Return if any problems.

## 2018-11-07 NOTE — ED Provider Notes (Signed)
[facility]    CSN: [id number] Arrival date & time: 11/06/18  1245     History   Chief Complaint Chief Complaint  Patient presents with  . Otalgia    HPI Alice Wagner is a 38 y.o. female.   The history is provided by the patient. No language interpreter was used.  Otalgia  Location:  Left Quality:  Aching Severity:  Moderate Onset quality:  Gradual Timing:  Constant Progression:  Worsening Chronicity:  New Relieved by:  Nothing Worsened by:  Nothing Ineffective treatments:  None tried Risk factors: no prior ear surgery     Past Medical History:  Diagnosis Date  . Ganglion of right wrist   . Right wrist pain   . Wears glasses     There are no active problems to display for this patient.   Past Surgical History:  Procedure Laterality Date  . CESAREAN SECTION  2012;  10-08-2009 ;  01-07-2005  . WRIST ARTHROSCOPY Right 05/29/2015   Procedure: RIGHT WRIST ARTHROSCOPY DEBRIDEMENT, ARTHROSCOPIC GANGLION CYST DECOMPRESSION;  Surgeon: Bradly BienenstockFred Ortmann, MD;  [facility];  Service: Orthopedics;  Laterality: Right;    OB History   No obstetric history on file.      Home Medications    Prior to Admission medications   Medication Sig Start Date End Date Taking? Authorizing Provider  amoxicillin (AMOXIL) 500 MG capsule Take 1 capsule (500 mg total) by mouth 3 (three) times daily. 11/06/18   Elson AreasSofia, Brigitta Pricer K, PA-C  bacitracin ointment Apply 1 application topically 2 (two) times daily. 12/28/15   Kirichenko, Tatyana, PA-C  docusate sodium (COLACE) 100 MG capsule Take 1 capsule (100 mg total) by mouth 2 (two) times daily. 05/29/15   Bradly Bienenstockrtmann, Fred, MD  Hydrocodone-Acetaminophen (VICODIN) 5-300 MG TABS Take 1 tablet by mouth 4 (four) times daily as needed (PAIN). 04/16/17   Mardella LaymanHagler, Brian, MD  hydrOXYzine (ATARAX/VISTARIL) 25 MG tablet Take 1 tablet (25 mg total) by mouth every 6 (six) hours. 06/15/15   Linna HoffKindl, James D, MD  ibuprofen  (ADVIL,MOTRIN) 200 MG tablet Take 200 mg by mouth every 6 (six) hours as needed.    [provider]  ibuprofen (ADVIL,MOTRIN) 600 MG tablet Take 1 tablet (600 mg total) by mouth every 6 (six) hours as needed. 12/28/15   Kirichenko, Lemont Fillersatyana, PA-C  neomycin-polymyxin-hydrocortisone (CORTISPORIN) OTIC solution Place 4 drops into the left ear 4 (four) times daily for 10 days. 11/06/18 11/16/18  Elson AreasSofia, Hawley Michel K, PA-C  ondansetron (ZOFRAN ODT) 4 MG disintegrating tablet Take 1 tablet (4 mg total) by mouth every 8 (eight) hours as needed for nausea or vomiting. 12/02/17   McDonald, Mia A, PA-C  oxyCODONE-acetaminophen (PERCOCET) 10-325 MG tablet Take 1 tablet by mouth every 4 (four) hours as needed for pain.    [provider]  permethrin (ELIMITE) 5 % cream Apply neck down before bed then wash off in morning. 04/16/17   Mardella LaymanHagler, Brian, MD  SUMAtriptan (IMITREX) 50 MG tablet Take 1 tablet for headache. May take one additional tablet in 2 hours if headache persists or recurs. 12/02/17   McDonald, Mia A, PA-C    Family History No family history on file.  Social History Social History   Tobacco Use  . Smoking status: Former Smoker    Years: 6.00    Types: Cigarettes    Last attempt to quit: 05/22/2013    Years since quitting: 5.4  . Smokeless tobacco: Never Used  Substance Use Topics  . Alcohol  use: No  . Drug use: No     Allergies   Aloe and Aspirin   Review of Systems Review of Systems  HENT: Positive for ear pain.   All other systems reviewed and are negative.    Physical Exam Triage Vital Signs ED Triage Vitals  Enc Vitals Group     BP 11/06/18 1407 119/83     Pulse Rate 11/06/18 1407 87     Resp 11/06/18 1407 12     Temp 11/06/18 1407 98.1 F (36.7 C)     Temp Source 11/06/18 1407 Oral     SpO2 11/06/18 1407 98 %     Weight --      Height --      Head Circumference --      Peak Flow --      Pain Score 11/06/18 1406 7     Pain Loc --      Pain Edu? --       Excl. in GC? --    No data found.  Updated Vital Signs BP 119/83 (BP Location: Left Arm)   Pulse 87   Temp 98.1 F (36.7 C) (Oral)   Resp 12   SpO2 98%   Visual Acuity Right Eye Distance:   Left Eye Distance:   Bilateral Distance:    Right Eye Near:   Left Eye Near:    Bilateral Near:     Physical Exam Vitals signs and nursing note reviewed.  Constitutional:      Appearance: She is well-developed.  HENT:     Head: Normocephalic.     Right Ear: Tympanic membrane normal.     Ears:     Comments: Left ear canal swollen, tender external ear     Nose: Nose normal.     Mouth/Throat:     Mouth: Mucous membranes are moist.  Eyes:     Pupils: Pupils are equal, round, and reactive to light.  Neck:     Musculoskeletal: Normal range of motion.  Cardiovascular:     Rate and Rhythm: Normal rate.  Pulmonary:     Effort: Pulmonary effort is normal.  Abdominal:     General: There is no distension.  Musculoskeletal: Normal range of motion.  Skin:    General: Skin is warm.  Neurological:     Mental Status: She is alert and oriented to person, place, and time.  Psychiatric:        Mood and Affect: Mood normal.      UC Treatments / Results  Labs (all labs ordered are listed, but only abnormal results are displayed) Labs Reviewed - No data to display  EKG None  Radiology No results found.  Procedures Procedures (including critical care time)  Medications Ordered in UC Medications - No data to display  Initial Impression / Assessment and Plan / UC Course  I have reviewed the triage vital signs and the nursing notes.  Pertinent labs & imaging results that were available during my care of the patient were reviewed by me and considered in my medical decision making (see chart for details).     MDM  Pt counseled on external otitis and tm bulging  Final Clinical Impressions(s) / UC Diagnoses   Final diagnoses:  Acute otitis media, unspecified otitis media type      Discharge Instructions     Return if any problems.   ED Prescriptions    Medication Sig Dispense Auth. Provider   amoxicillin (AMOXIL) 500 MG capsule  (  Status: Discontinued) Take 1 capsule (500 mg total) by mouth 3 (three) times daily. 21 capsule Yonatan Guitron K, [place]   amoxicillin (AMOXIL) 500 MG capsule Take 1 capsule (500 mg total) by mouth 3 (three) times daily. 30 capsule Trevor Duty K, [place]   neomycin-polymyxin-hydrocortisone (CORTISPORIN) OTIC solution Place 4 drops into the left ear 4 (four) times daily for 10 days. 10 mL Elson Areas, PA-C     Controlled Substance Prescriptions [place] Controlled Substance Registry consulted? Not Applicable   Elson Areas, [place] 11/07/18 1455

## 2018-11-09 ENCOUNTER — Ambulatory Visit: Payer: Medicaid Other | Admitting: Occupational Therapy

## 2018-11-14 ENCOUNTER — Ambulatory Visit: Payer: Medicaid Other | Admitting: Occupational Therapy

## 2018-11-30 ENCOUNTER — Emergency Department (HOSPITAL_COMMUNITY)
Admission: EM | Admit: 2018-11-30 | Discharge: 2018-11-30 | Disposition: A | Discharge status: Home / Self Care | Payer: Medicaid Other | Attending: Emergency Medicine | Admitting: Emergency Medicine

## 2018-11-30 DIAGNOSIS — Z87891 Personal history of nicotine dependence: Secondary | ICD-10-CM | POA: Diagnosis not present

## 2018-11-30 DIAGNOSIS — H60502 Unspecified acute noninfective otitis externa, left ear: Secondary | ICD-10-CM

## 2018-11-30 DIAGNOSIS — R1030 Lower abdominal pain, unspecified: Secondary | ICD-10-CM | POA: Diagnosis present

## 2018-11-30 DIAGNOSIS — Z79899 Other long term (current) drug therapy: Secondary | ICD-10-CM | POA: Diagnosis not present

## 2018-11-30 DIAGNOSIS — R1084 Generalized abdominal pain: Secondary | ICD-10-CM | POA: Insufficient documentation

## 2018-11-30 LAB — COMPREHENSIVE METABOLIC PANEL
ALBUMIN: 4.3 g/dL (ref 3.5–5.0)
ALK PHOS: 78 U/L (ref 38–126)
ALT: 24 U/L (ref 0–44)
ANION GAP: 12 (ref 5–15)
AST: 17 U/L (ref 15–41)
BUN: 14 mg/dL (ref 6–20)
CALCIUM: 9.4 mg/dL (ref 8.9–10.3)
CO2: 22 mmol/L (ref 22–32)
Chloride: 105 mmol/L (ref 98–111)
Creatinine, Ser: 0.8 mg/dL (ref 0.44–1.00)
GFR calc non Af Amer: >60 mL/min (ref >60)
GLUCOSE: 98 mg/dL (ref 70–99)
Potassium: 4.2 mmol/L (ref 3.5–5.1)
SODIUM: 139 mmol/L (ref 135–145)
Total Bilirubin: 0.4 mg/dL (ref 0.3–1.2)
Total Protein: 7.4 g/dL (ref 6.5–8.1)

## 2018-11-30 LAB — URINALYSIS, ROUTINE W REFLEX MICROSCOPIC
Bilirubin Urine: NEGATIVE
GLUCOSE, UA: NEGATIVE mg/dL
HGB URINE DIPSTICK: NEGATIVE
Ketones, ur: NEGATIVE mg/dL
LEUKOCYTE UA: NEGATIVE
Nitrite: NEGATIVE
PROTEIN: NEGATIVE mg/dL
Specific Gravity, Urine: 1.017 (ref 1.005–1.030)
pH: 5 (ref 5.0–8.0)

## 2018-11-30 LAB — CBC
HCT: 43.1 % (ref 36.0–46.0)
HEMOGLOBIN: 13.5 g/dL (ref 12.0–15.0)
MCH: 26.2 pg (ref 26.0–34.0)
MCHC: 31.3 g/dL (ref 30.0–36.0)
MCV: 83.7 fL (ref 80.0–100.0)
NRBC: 0 % (ref 0.0–0.2)
PLATELETS: 310 10*3/uL (ref 150–400)
RBC: 5.15 MIL/uL — AB (ref 3.87–5.11)
RDW: 14.2 % (ref 11.5–15.5)
WBC: 6 10*3/uL (ref 4.0–10.5)

## 2018-11-30 LAB — LIPASE, BLOOD: LIPASE: 25 U/L (ref 11–51)

## 2018-11-30 LAB — I-STAT BETA HCG BLOOD, ED (MC, WL, AP ONLY)

## 2018-11-30 MED ORDER — FAMOTIDINE 20 MG PO TABS: 20.0000 mg | ORAL_TABLET | Freq: Every day | ORAL | 0 refills | Status: DC

## 2018-11-30 MED ORDER — CIPROFLOXACIN-DEXAMETHASONE 0.3-0.1 % OT SUSP: 4.0000 [drp] | Freq: Two times a day (BID) | OTIC | 0 refills | Status: DC

## 2018-11-30 NOTE — ED Triage Notes (Signed)
Pt endorses lower abd pain/lower back pain "off an on for a while" and left ear pain x 2 weeks, pt had antibiotic ear drops that was prescribed by [facility] and the pt finished them but does not feel better. Denies urinary sx or n/v/d.

## 2018-11-30 NOTE — ED Provider Notes (Signed)
[facility] EMERGENCY DEPARTMENT Provider Note   CSN: [id number] Arrival date & time: 11/30/18  6546     History   Chief Complaint Chief Complaint  Patient presents with  . Abdominal Pain  . Otalgia    HPI Alice Wagner is a 38 y.o. female.  Pt presents to the ED today with left ear pain and abdominal pain.  Left ear has been hurting for about 2 weeks.  She went to UC and was diagnosed with OM and OE.  The pt was put on amox and cortisporin.  She did take those meds, and her ear still hurts.  She has also had generalized abd pain for a week.  She is worried that she's pregnant because her period in January was not a normal one.  She said it was just spotting.     Past Medical History:  Diagnosis Date  . Ganglion of right wrist   . Right wrist pain   . Wears glasses     There are no active problems to display for this patient.   Past Surgical History:  Procedure Laterality Date  . CESAREAN SECTION  2012;  10-08-2009 ;  01-07-2005  . WRIST ARTHROSCOPY Right 05/29/2015   Procedure: RIGHT WRIST ARTHROSCOPY DEBRIDEMENT, ARTHROSCOPIC GANGLION CYST DECOMPRESSION;  Surgeon: Bradly Bienenstock, MD;  Location: [facility];  Service: Orthopedics;  Laterality: Right;     OB History   No obstetric history on file.      Home Medications    Prior to Admission medications   Medication Sig Start Date End Date Taking? Authorizing Provider  amoxicillin (AMOXIL) 500 MG capsule Take 1 capsule (500 mg total) by mouth 3 (three) times daily. 11/06/18   Elson Areas, PA-C  bacitracin ointment Apply 1 application topically 2 (two) times daily. 12/28/15   Kirichenko, Lemont Fillers, PA-C  ciprofloxacin-dexamethasone (CIPRODEX) OTIC suspension Place 4 drops into the left ear 2 (two) times daily. 11/30/18   Jacalyn Lefevre, MD  docusate sodium (COLACE) 100 MG capsule Take 1 capsule (100 mg total) by mouth 2 (two) times daily. 05/29/15   Bradly Bienenstock, MD  famotidine  (PEPCID) 20 MG tablet Take 1 tablet (20 mg total) by mouth daily. 11/30/18   Jacalyn Lefevre, MD  Hydrocodone-Acetaminophen (VICODIN) 5-300 MG TABS Take 1 tablet by mouth 4 (four) times daily as needed (PAIN). 04/16/17   Mardella Layman, MD  hydrOXYzine (ATARAX/VISTARIL) 25 MG tablet Take 1 tablet (25 mg total) by mouth every 6 (six) hours. 06/15/15   Linna Hoff, MD  ibuprofen (ADVIL,MOTRIN) 200 MG tablet Take 200 mg by mouth every 6 (six) hours as needed.    [provider]  ibuprofen (ADVIL,MOTRIN) 600 MG tablet Take 1 tablet (600 mg total) by mouth every 6 (six) hours as needed. 12/28/15   Kirichenko, Tatyana, PA-C  ondansetron (ZOFRAN ODT) 4 MG disintegrating tablet Take 1 tablet (4 mg total) by mouth every 8 (eight) hours as needed for nausea or vomiting. 12/02/17   McDonald, Mia A, PA-C  oxyCODONE-acetaminophen (PERCOCET) 10-325 MG tablet Take 1 tablet by mouth every 4 (four) hours as needed for pain.    [provider]  permethrin (ELIMITE) 5 % cream Apply neck down before bed then wash off in morning. 04/16/17   Mardella Layman, MD  SUMAtriptan (IMITREX) 50 MG tablet Take 1 tablet for headache. May take one additional tablet in 2 hours if headache persists or recurs. 12/02/17   Barkley Boards, PA-C    Family  History No family history on file.  Social History Social History   Tobacco Use  . Smoking status: Former Smoker    Years: 6.00    Types: Cigarettes    Last attempt to quit: 05/22/2013    Years since quitting: 5.5  . Smokeless tobacco: Never Used  Substance Use Topics  . Alcohol use: No  . Drug use: No     Allergies   Aloe and Aspirin   Review of Systems Review of Systems  HENT: Positive for ear pain.   Gastrointestinal: Positive for abdominal pain.  All other systems reviewed and are negative.    Physical Exam Updated Vital Signs BP (!) 148/94 (BP Location: Right Arm)   Pulse 90   Temp 98 F (36.7 C) (Oral)   Resp 16   LMP  (LMP Unknown)   SpO2  100%   Physical Exam Vitals signs and nursing note reviewed.  Constitutional:      Appearance: She is well-developed.  HENT:     Head: Normocephalic and atraumatic.     Left Ear: Tympanic membrane normal.     Ears:     Comments: Left canal inflamed with some debris    Mouth/Throat:     Mouth: Mucous membranes are moist.     Pharynx: Oropharynx is clear.  Eyes:     Extraocular Movements: Extraocular movements intact.     Pupils: Pupils are equal, round, and reactive to light.  Cardiovascular:     Rate and Rhythm: Normal rate and regular rhythm.  Pulmonary:     Effort: Pulmonary effort is normal.     Breath sounds: Normal breath sounds.  Abdominal:     General: Abdomen is flat. Bowel sounds are normal.     Palpations: Abdomen is soft.     Tenderness: There is generalized abdominal tenderness.  Skin:    General: Skin is warm.     Capillary Refill: Capillary refill takes less than 2 seconds.  Neurological:     General: No focal deficit present.     Mental Status: She is alert and oriented to person, place, and time.      ED Treatments / Results  Labs (all labs ordered are listed, but only abnormal results are displayed) Labs Reviewed  CBC - Abnormal; Notable for the following components:      Result Value   RBC 5.15 (*)    All other components within normal limits  LIPASE, BLOOD  COMPREHENSIVE METABOLIC PANEL  URINALYSIS, ROUTINE W REFLEX MICROSCOPIC  I-STAT BETA HCG BLOOD, ED ([facility], [facility], [facility] ONLY)    EKG None  Radiology No results found.  Procedures Procedures (including critical care time)  Medications Ordered in ED Medications - No data to display   Initial Impression / Assessment and Plan / ED Course  I have reviewed the triage vital signs and the nursing notes.  Pertinent labs & imaging results that were available during my care of the patient were reviewed by me and considered in my medical decision making (see chart for details).     Pt will be  started on ciprodex for her left OE.  Abd pain is very mild, and I think she mainly wanted to know if she was pregnant.  Labs nl, so I don't feel that any further imaging is necessary.  Pt is stable for d/c.  She knows to return if worse.  F/u with pcp and with ENT if ear does not improve.  Final Clinical Impressions(s) / ED Diagnoses  Final diagnoses:  Acute otitis externa of left ear, unspecified type  Generalized abdominal pain    ED Discharge Orders         Ordered    ciprofloxacin-dexamethasone (CIPRODEX) OTIC suspension  2 times daily     11/30/18 0944    famotidine (PEPCID) 20 MG tablet  Daily     11/30/18 0944           Jacalyn Lefevre, MD 11/30/18 [phone]

## 2018-12-05 NOTE — Therapy (Signed)
[facility] [address] [place], [place], [address] Phone: [phone]   Fax:  [phone]  Patient Details  Name: Alice Wagner MRN: [id number] Date of Birth: [birth year] Referring Provider:  No ref. provider found  Encounter Date: 12/05/2018  OCCUPATIONAL THERAPY DISCHARGE SUMMARY  Visits from Start of Care: 7  Current functional level related to goals / functional outcomes: OT Short Term Goals - 10/13/18 1440      OT SHORT TERM GOAL #1   Title  Pt will be independent with initial HEP for ROM.--check STGs 09/20/18    Baseline  dependent    Status  Achieved      OT SHORT TERM GOAL #2   Title  Pt will demo at least 25* R wrist extension for ADLs.    Baseline  eval = only to neutral, 09/11/18 = 45*     Status  Achieved   10/13/18: wrist extension = 50*     OT SHORT TERM GOAL #3   Title  Pt will demo at least 30* R wrist flexion for ADLs.    Baseline  eval = 10*, 09/11/18 = 25*     Status  Achieved   10/13/18: Rt wrist flex = 45*      OT SHORT TERM GOAL #4   Title  Pt will demo at least 90% gross finger flexion for grasp of objects.    Baseline  60*    Status  Achieved      OT Long Term Goals - 10/13/18 1441      OT LONG TERM GOAL #1   Title  Pt will be independent with strengthening HEP.--check LTGs 11/04/18    Baseline  eval = dependent, 09/11/18 - just issued     Status  Partially Met   will need upgrades     OT LONG TERM GOAL #2   Title  Pt will report pain less than or equal to 5/10 with R hand functional use.    Baseline  7-9/10 with use (3/10 with rest)    Status  On-going      OT LONG TERM GOAL #3   Title  Pt will improve R grip strength to at least 30lbs for opening containers.    Baseline  R-4lbs, 09/11/18 = 5 lbs    Status  On-going   10/13/18: Rt = 28 lbs     OT LONG TERM GOAL #4   Title  Pt will demo at least 45* R wrist flex/ext for ADLs/IADLs.    Baseline  wrist flex 10*,  extension to neutral    Status  Achieved   flex = 45*, ext = 50*     OT LONG TERM GOAL #5   Title  Pt to return to using Rt hand as dominant hand for BADLS     Baseline  using LUE for eating, brushing teeth, tolieting hygiene    Status  Partially Met   As of 10/13/18: eating 40% with Rt hand, brushing teeth with Rt hand, still wiping with Lt hand     OT LONG TERM GOAL #6   Title  Pt will demo improved coordination, finger ROM as shown by completing 9-hole peg test in less than 35sec.    Baseline  61.72sec    Status  Achieved   10/13/18:  23 sec.         Remaining deficits: Pain Strength   Education / Equipment: HEP's, splint wear and care  Plan:  Patient goals were partially met. Patient is being discharged due to not returning since the last visit.  (on 10/30/18)?????        Carey Bullocks, OTR/L 12/05/2018, 9:43 AM  [facility] [address] [place], [place], [address] Phone: [phone]   Fax:  [phone]

## 2019-02-14 ENCOUNTER — Encounter: Payer: Self-pay | Admitting: Emergency Medicine

## 2019-02-14 ENCOUNTER — Ambulatory Visit
Admission: EM | Admit: 2019-02-14 | Discharge: 2019-02-14 | Disposition: A | Discharge status: Home / Self Care | Payer: Medicaid Other | Attending: Family Medicine | Admitting: Family Medicine

## 2019-02-14 ENCOUNTER — Other Ambulatory Visit: Payer: Self-pay

## 2019-02-14 DIAGNOSIS — Z202 Contact with and (suspected) exposure to infections with a predominantly sexual mode of transmission: Secondary | ICD-10-CM | POA: Diagnosis present

## 2019-02-14 MED ORDER — AZITHROMYCIN 500 MG PO TABS
1000.0000 mg | ORAL_TABLET | Freq: Once | ORAL | Status: AC
  Administered 2019-02-14: 15:00:00 1000 mg via ORAL

## 2019-02-14 MED ORDER — CEFTRIAXONE SODIUM 250 MG IJ SOLR
250.0000 mg | Freq: Once | INTRAMUSCULAR | Status: AC
  Administered 2019-02-14: 15:00:00 250 mg via INTRAMUSCULAR

## 2019-02-14 NOTE — ED Notes (Signed)
Patient able to ambulate independently  

## 2019-02-14 NOTE — ED Triage Notes (Signed)
Pt presents to [facility] for assessment after her boyfriend was recently treated for gonorrhea and chlamydia.

## 2019-02-14 NOTE — ED Provider Notes (Signed)
[facility]    CSN: [id number] Arrival date & time: 02/14/19  1409     History   Chief Complaint Chief Complaint  Patient presents with  . Exposure to STD    HPI Alice Wagner is a 38 y.o. female.   Pt is a 38 year old female that presents for STD exposure. Reports that her boyfriend has been having dysuria and penile discharge and was just tested and treated for STDs. She denies any vaginal discharge, dysuria, hematuria, abdominal pain, back pain or fevers. Patient's last menstrual period was 02/14/2019.  ROS per HPI        Past Medical History:  Diagnosis Date  . Ganglion of right wrist   . Right wrist pain   . Wears glasses     There are no active problems to display for this patient.   Past Surgical History:  Procedure Laterality Date  . CESAREAN SECTION  2012;  10-08-2009 ;  01-07-2005  . WRIST ARTHROSCOPY Right 05/29/2015   Procedure: RIGHT WRIST ARTHROSCOPY DEBRIDEMENT, ARTHROSCOPIC GANGLION CYST DECOMPRESSION;  Surgeon: Bradly BienenstockFred Ortmann, MD;  [facility];  Service: Orthopedics;  Laterality: Right;    OB History   No obstetric history on file.      Home Medications    Prior to Admission medications   Medication Sig Start Date End Date Taking? Authorizing Provider  bacitracin ointment Apply 1 application topically 2 (two) times daily. 12/28/15   Kirichenko, Lemont Fillersatyana, PA-C  Hydrocodone-Acetaminophen (VICODIN) 5-300 MG TABS Take 1 tablet by mouth 4 (four) times daily as needed (PAIN). 04/16/17   Mardella LaymanHagler, Brian, MD  hydrOXYzine (ATARAX/VISTARIL) 25 MG tablet Take 1 tablet (25 mg total) by mouth every 6 (six) hours. 06/15/15   Linna HoffKindl, James D, MD  ibuprofen (ADVIL,MOTRIN) 200 MG tablet Take 200 mg by mouth every 6 (six) hours as needed.    [provider]  ibuprofen (ADVIL,MOTRIN) 600 MG tablet Take 1 tablet (600 mg total) by mouth every 6 (six) hours as needed. 12/28/15   Kirichenko, Lemont Fillersatyana, PA-C  SUMAtriptan  (IMITREX) 50 MG tablet Take 1 tablet for headache. May take one additional tablet in 2 hours if headache persists or recurs. 12/02/17   McDonald, Coral ElseMia A, PA-C    Family History History reviewed. No pertinent family history.  Social History Social History   Tobacco Use  . Smoking status: Former Smoker    Years: 6.00    Types: Cigarettes    Last attempt to quit: 05/22/2013    Years since quitting: 5.7  . Smokeless tobacco: Never Used  Substance Use Topics  . Alcohol use: No  . Drug use: No     Allergies   Aloe and Aspirin   Review of Systems Review of Systems  Genitourinary: Negative for decreased urine volume, difficulty urinating, dyspareunia, dysuria, enuresis, flank pain, frequency, genital sores, hematuria, menstrual problem, pelvic pain, urgency, vaginal bleeding, vaginal discharge and vaginal pain.     Physical Exam Triage Vital Signs ED Triage Vitals  Enc Vitals Group     BP 02/14/19 1420 128/79     Pulse Rate 02/14/19 1420 85     Resp 02/14/19 1420 18     Temp 02/14/19 1420 97.9 F (36.6 C)     Temp Source 02/14/19 1420 Oral     SpO2 02/14/19 1420 99 %     Weight --      Height --      Head Circumference --      Peak  Flow --      Pain Score 02/14/19 1419 0     Pain Loc --      Pain Edu? --      Excl. in GC? --    No data found.  Updated Vital Signs BP 128/79 (BP Location: Right Arm)   Pulse 85   Temp 97.9 F (36.6 C) (Oral)   Resp 18   LMP 02/14/2019   SpO2 99%   Visual Acuity Right Eye Distance:   Left Eye Distance:   Bilateral Distance:    Right Eye Near:   Left Eye Near:    Bilateral Near:     Physical Exam Vitals signs and nursing note reviewed.  Constitutional:      General: She is not in acute distress.    Appearance: Normal appearance. She is not ill-appearing, toxic-appearing or diaphoretic.  HENT:     Head: Normocephalic.     Nose: Nose normal.     Mouth/Throat:     Pharynx: Oropharynx is clear.  Eyes:      Conjunctiva/sclera: Conjunctivae normal.  Neck:     Musculoskeletal: Normal range of motion.  Pulmonary:     Effort: Pulmonary effort is normal.  Abdominal:     Palpations: Abdomen is soft.     Tenderness: There is no abdominal tenderness.  Musculoskeletal: Normal range of motion.  Skin:    General: Skin is warm and dry.     Findings: No rash.  Neurological:     Mental Status: She is alert.  Psychiatric:        Mood and Affect: Mood normal.      UC Treatments / Results  Labs (all labs ordered are listed, but only abnormal results are displayed) Labs Reviewed  CERVICOVAGINAL ANCILLARY ONLY    EKG None  Radiology No results found.  Procedures Procedures (including critical care time)  Medications Ordered in UC Medications  cefTRIAXone (ROCEPHIN) injection 250 mg (250 mg Intramuscular Given 02/14/19 1445)  azithromycin (ZITHROMAX) tablet 1,000 mg (1,000 mg Oral Given 02/14/19 1445)    Initial Impression / Assessment and Plan / UC Course  I have reviewed the triage vital signs and the nursing notes.  Pertinent labs & imaging results that were available during my care of the patient were reviewed by me and considered in my medical decision making (see chart for details).    STD exposure.   Based on exposure treating for STDs.  Self swab sent for testing.  Labs pending.  Follow up as needed for continued or worsening symptoms  Final Clinical Impressions(s) / UC Diagnoses   Final diagnoses:  Exposure to STD     Discharge Instructions     We are treating you prophylactically today for gonorrhea and chlamydia Sending your swab for testing and we will call you with any positive results.  You can also check your MyChart for results    ED Prescriptions    None     Controlled Substance Prescriptions [place] Controlled Substance Registry consulted? Not Applicable   Janace Aris, NP 02/14/19 1745

## 2019-02-14 NOTE — Discharge Instructions (Addendum)
We are treating you prophylactically today for gonorrhea and chlamydia Sending your swab for testing and we will call you with any positive results.  You can also check your MyChart for results

## 2019-02-15 ENCOUNTER — Telehealth (HOSPITAL_COMMUNITY): Payer: Self-pay | Admitting: Emergency Medicine

## 2019-02-15 LAB — CERVICOVAGINAL ANCILLARY ONLY
Chlamydia: NEGATIVE
Neisseria Gonorrhea: NEGATIVE
Trichomonas: POSITIVE — AB

## 2019-02-15 MED ORDER — METRONIDAZOLE 500 MG PO TABS: 2000.0000 mg | ORAL_TABLET | Freq: Once | ORAL | 0 refills | Status: AC

## 2019-02-15 NOTE — Telephone Encounter (Signed)
Trichomonas is positive. Rx  for Flagyl 2 grams, once was sent to the pharmacy of record. Pt needs education to refrain from sexual intercourse for 7 days to give the medicine time to work. Sexual partners need to be notified and tested/treated. Condoms may reduce risk of reinfection. Recheck for further evaluation if symptoms are not improving.   Patient contacted and made aware of all results, all questions answered.   

## 2019-04-25 ENCOUNTER — Other Ambulatory Visit: Payer: Self-pay

## 2019-04-25 ENCOUNTER — Encounter (HOSPITAL_COMMUNITY): Payer: Self-pay

## 2019-04-25 ENCOUNTER — Ambulatory Visit (HOSPITAL_COMMUNITY)
Admission: EM | Admit: 2019-04-25 | Discharge: 2019-04-25 | Disposition: A | Discharge status: Home / Self Care | Payer: Medicaid Other | Attending: Family Medicine | Admitting: Family Medicine

## 2019-04-25 ENCOUNTER — Ambulatory Visit (INDEPENDENT_AMBULATORY_CARE_PROVIDER_SITE_OTHER): Payer: Medicaid Other

## 2019-04-25 DIAGNOSIS — S6991XA Unspecified injury of right wrist, hand and finger(s), initial encounter: Secondary | ICD-10-CM

## 2019-04-25 DIAGNOSIS — W228XXA Striking against or struck by other objects, initial encounter: Secondary | ICD-10-CM | POA: Diagnosis not present

## 2019-04-25 NOTE — ED Triage Notes (Addendum)
Pt states she hit her hand on metal structure last night as she was walking along and she was swing her arm and her hand hit a metal piece. Pt states she has had surgery on her hand before around a year ago.

## 2019-04-25 NOTE — Discharge Instructions (Addendum)
Your x-ray was negative for any fractures Rest, ice and elevate the hand.  You can take up to 800 mg of ibuprofen as needed every 8 hours for pain For continued or worsening symptoms you need to follow-up with your orthopedic.

## 2019-04-25 NOTE — ED Provider Notes (Signed)
[facility]    CSN: [id number] Arrival date & time: 04/25/19  1047     History   Chief Complaint Chief Complaint  Patient presents with  . Hand Pain    HPI Alice Wagner is a 38 y.o. female.   Patient is a 38 year old female that presents today with right hand pain.  She injured her hand last night when she was walking, swinging her arm and hit her hand on the metal piece.  History of right wrist surgery.  Since the hand has become more painful and swollen overnight.  She has not taken anything for the pain.  Reporting decreased range of motion of the hand, wrist and fingers.  Denies any numbness or tingling.  ROS per HPI      Past Medical History:  Diagnosis Date  . Ganglion of right wrist   . Right wrist pain   . Wears glasses     There are no active problems to display for this patient.   Past Surgical History:  Procedure Laterality Date  . CESAREAN SECTION  2012;  10-08-2009 ;  01-07-2005  . WRIST ARTHROSCOPY Right 05/29/2015   Procedure: RIGHT WRIST ARTHROSCOPY DEBRIDEMENT, ARTHROSCOPIC GANGLION CYST DECOMPRESSION;  Surgeon: Bradly BienenstockFred Ortmann, MD;  [facility] [place];  Service: Orthopedics;  Laterality: Right;    OB History   No obstetric history on file.      Home Medications    Prior to Admission medications   Medication Sig Start Date End Date Taking? Authorizing Provider  ibuprofen (ADVIL,MOTRIN) 200 MG tablet Take 200 mg by mouth every 6 (six) hours as needed.    [provider]  SUMAtriptan (IMITREX) 50 MG tablet Take 1 tablet for headache. May take one additional tablet in 2 hours if headache persists or recurs. 12/02/17   McDonald, Coral ElseMia A, PA-C    Family History History reviewed. No pertinent family history.  Social History Social History   Tobacco Use  . Smoking status: Former Smoker    Years: 6.00    Types: Cigarettes    Quit date: 05/22/2013    Years since quitting: 5.9  . Smokeless tobacco: Never Used   Substance Use Topics  . Alcohol use: No  . Drug use: No     Allergies   Aloe and Aspirin   Review of Systems Review of Systems   Physical Exam Triage Vital Signs ED Triage Vitals  Enc Vitals Group     BP 04/25/19 1123 109/75     Pulse Rate 04/25/19 1123 83     Resp 04/25/19 1123 16     Temp 04/25/19 1123 98.3 F (36.8 C)     Temp src --      SpO2 04/25/19 1123 100 %     Weight 04/25/19 1120 160 lb (72.6 kg)     Height --      Head Circumference --      Peak Flow --      Pain Score 04/25/19 1120 10     Pain Loc --      Pain Edu? --      Excl. in GC? --    No data found.  Updated Vital Signs BP 109/75 (BP Location: Right Arm)   Pulse 83   Temp 98.3 F (36.8 C)   Resp 16   Wt 160 lb (72.6 kg)   LMP 04/17/2019 (Exact Date)   SpO2 100%   BMI 26.63 kg/m   Visual Acuity Right Eye Distance:  Left Eye Distance:   Bilateral Distance:    Right Eye Near:   Left Eye Near:    Bilateral Near:     Physical Exam Vitals signs and nursing note reviewed.  Constitutional:      Appearance: Normal appearance.  HENT:     Head: Normocephalic and atraumatic.  Eyes:     Conjunctiva/sclera: Conjunctivae normal.  Neck:     Musculoskeletal: Normal range of motion.  Pulmonary:     Effort: Pulmonary effort is normal.  Musculoskeletal:       Arms:     Right hand: She exhibits decreased range of motion, tenderness, bony tenderness and swelling. She exhibits normal capillary refill, no deformity and no laceration. Normal sensation noted.       Hands:  Skin:    General: Skin is warm and dry.  Neurological:     Mental Status: She is alert.  Psychiatric:        Mood and Affect: Mood normal.      UC Treatments / Results  Labs (all labs ordered are listed, but only abnormal results are displayed) Labs Reviewed - No data to display  EKG   Radiology Dg Hand Complete Right  Result Date: 04/25/2019 CLINICAL DATA:  Right hand pain after hitting a piece of metal  last night. EXAM: RIGHT HAND - COMPLETE 3+ VIEW COMPARISON:  None. FINDINGS: There is no evidence of fracture or dislocation. There is no evidence of arthropathy or other focal bone abnormality. Soft tissues are unremarkable. IMPRESSION: Negative. Electronically Signed   By: Titus Dubin M.D.   On: 04/25/2019 11:56    Procedures Procedures (including critical care time)  Medications Ordered in UC Medications - No data to display  Initial Impression / Assessment and Plan / UC Course  I have reviewed the triage vital signs and the nursing notes.  Pertinent labs & imaging results that were available during my care of the patient were reviewed by me and considered in my medical decision making (see chart for details).     X-ray negative for any acute abnormalities. Will place patient in splint and have her follow-up with Ortho tomorrow as planned. Rest, ice, elevate and ibuprofen as needed for pain. Final Clinical Impressions(s) / UC Diagnoses   Final diagnoses:  None     Discharge Instructions     Your x-ray was negative for any fractures Rest, ice and elevate the hand.  You can take up to 800 mg of ibuprofen as needed every 8 hours for pain For continued or worsening symptoms you need to follow-up with your orthopedic.    ED Prescriptions    None     Controlled Substance Prescriptions [place] Controlled Substance Registry consulted? Not Applicable   Orvan July, NP 04/25/19 1218

## 2019-05-19 ENCOUNTER — Emergency Department (HOSPITAL_COMMUNITY): Payer: Medicaid Other

## 2019-05-19 ENCOUNTER — Other Ambulatory Visit: Payer: Self-pay

## 2019-05-19 ENCOUNTER — Encounter (HOSPITAL_COMMUNITY): Payer: Self-pay

## 2019-05-19 ENCOUNTER — Emergency Department (HOSPITAL_COMMUNITY)
Admission: EM | Admit: 2019-05-19 | Discharge: 2019-05-19 | Disposition: A | Discharge status: Home / Self Care | Payer: Medicaid Other | Attending: Emergency Medicine | Admitting: Emergency Medicine

## 2019-05-19 DIAGNOSIS — Z87891 Personal history of nicotine dependence: Secondary | ICD-10-CM | POA: Diagnosis not present

## 2019-05-19 DIAGNOSIS — F4321 Adjustment disorder with depressed mood: Secondary | ICD-10-CM | POA: Insufficient documentation

## 2019-05-19 DIAGNOSIS — M940 Chondrocostal junction syndrome [Tietze]: Secondary | ICD-10-CM | POA: Insufficient documentation

## 2019-05-19 DIAGNOSIS — R079 Chest pain, unspecified: Secondary | ICD-10-CM | POA: Diagnosis present

## 2019-05-19 DIAGNOSIS — Z79899 Other long term (current) drug therapy: Secondary | ICD-10-CM | POA: Insufficient documentation

## 2019-05-19 DIAGNOSIS — F419 Anxiety disorder, unspecified: Secondary | ICD-10-CM | POA: Diagnosis not present

## 2019-05-19 LAB — BASIC METABOLIC PANEL
Anion gap: 11 (ref 5–15)
BUN: 12 mg/dL (ref 6–20)
CO2: 20 mmol/L — ABNORMAL LOW (ref 22–32)
Calcium: 8.9 mg/dL (ref 8.9–10.3)
Chloride: 105 mmol/L (ref 98–111)
Creatinine, Ser: 0.72 mg/dL (ref 0.44–1.00)
GFR calc Af Amer: >60 mL/min (ref >60)
GFR calc non Af Amer: >60 mL/min (ref >60)
Glucose, Bld: 94 mg/dL (ref 70–99)
Potassium: 3.4 mmol/L — ABNORMAL LOW (ref 3.5–5.1)
Sodium: 136 mmol/L (ref 135–145)

## 2019-05-19 LAB — CBC
HCT: 39.9 % (ref 36.0–46.0)
Hemoglobin: 12.9 g/dL (ref 12.0–15.0)
MCH: 27.3 pg (ref 26.0–34.0)
MCHC: 32.3 g/dL (ref 30.0–36.0)
MCV: 84.5 fL (ref 80.0–100.0)
Platelets: 343 10*3/uL (ref 150–400)
RBC: 4.72 MIL/uL (ref 3.87–5.11)
RDW: 13.5 % (ref 11.5–15.5)
WBC: 6.9 10*3/uL (ref 4.0–10.5)
nRBC: 0 % (ref 0.0–0.2)

## 2019-05-19 LAB — I-STAT BETA HCG BLOOD, ED (MC, WL, AP ONLY): I-stat hCG, quantitative: <5 m[IU]/mL (ref <5)

## 2019-05-19 LAB — TROPONIN I (HIGH SENSITIVITY): Troponin I (High Sensitivity): <2 ng/L (ref <18)

## 2019-05-19 MED ORDER — SODIUM CHLORIDE 0.9% FLUSH: 3.0000 mL | Freq: Once | INTRAVENOUS | Status: DC

## 2019-05-19 NOTE — ED Notes (Signed)
Report called to ann on2c

## 2019-05-19 NOTE — ED Provider Notes (Signed)
[place] EMERGENCY DEPARTMENT Provider Note   CSN: [id number] Arrival date & time: 05/19/19  1740    History   Chief Complaint Chief Complaint  Patient presents with  . Chest Pain    HPI Alice Wagner is a 38 y.o. female.     HPI Patient presents for evaluation of anxiety and "a knot in my chest".  States that over the last several days she has had soreness in part of the left middle side of her chest.  Worse with palpation.  Feels it is more firm there or has irregular contour when compared to the right side.  Works in [employer] and is often busy moving things.  Denies trauma or abrupt onset.  No constitutional symptoms of illness.  She does endorse increased anxiety due to the pain she is concerned that there may be something wrong with her heart.  Also reports that her grandmother died 2 weeks ago and she is concerned that she will lose her job due to [employer] off workers.  Past Medical History:  Diagnosis Date  . Ganglion of right wrist   . Right wrist pain   . Wears glasses     There are no active problems to display for this patient.   Past Surgical History:  Procedure Laterality Date  . CESAREAN SECTION  2012;  10-08-2009 ;  01-07-2005  . WRIST ARTHROSCOPY Right 05/29/2015   Procedure: RIGHT WRIST ARTHROSCOPY DEBRIDEMENT, ARTHROSCOPIC GANGLION CYST DECOMPRESSION;  Surgeon: Iran Planas, MD;  Location: [place];  Service: Orthopedics;  Laterality: Right;     OB History   No obstetric history on file.      Home Medications    Prior to Admission medications   Medication Sig Start Date End Date Taking? Authorizing Provider  ibuprofen (ADVIL,MOTRIN) 200 MG tablet Take 200 mg by mouth every 6 (six) hours as needed.    [provider]  SUMAtriptan (IMITREX) 50 MG tablet Take 1 tablet for headache. May take one additional tablet in 2 hours if headache persists or recurs. 12/02/17   McDonald, Mia A, PA-C     Family History No family history on file.  Social History Social History   Tobacco Use  . Smoking status: Former Smoker    Years: 6.00    Types: Cigarettes    Quit date: 05/22/2013    Years since quitting: 5.9  . Smokeless tobacco: Never Used  Substance Use Topics  . Alcohol use: No  . Drug use: No     Allergies   Aloe and Aspirin   Review of Systems Review of Systems  Constitutional: Negative for chills and fever.  HENT: Negative for ear pain and sore throat.   Eyes: Negative for pain and visual disturbance.  Respiratory: Negative for cough and shortness of breath.   Cardiovascular: Positive for chest pain. Negative for palpitations.  Gastrointestinal: Negative for abdominal pain and vomiting.  Genitourinary: Negative for dysuria and hematuria.  Musculoskeletal: Negative for arthralgias and back pain.  Skin: Negative for color change and rash.  Neurological: Negative for seizures and syncope.  Psychiatric/Behavioral: The patient is nervous/anxious.   All other systems reviewed and are negative.    Physical Exam Updated Vital Signs BP 138/90 (BP Location: Left Arm)   Pulse 98   Temp 98.9 F (37.2 C) (Oral)   Resp 16   Ht 5\' 4"  (1.626 m)   Wt 72.6 kg   LMP 05/18/2019 (Exact Date)   SpO2 98%  BMI 27.46 kg/m   Physical Exam Vitals signs and nursing note reviewed.  Constitutional:      General: She is not in acute distress.    Appearance: She is well-developed.  HENT:     Head: Normocephalic and atraumatic.  Eyes:     Conjunctiva/sclera: Conjunctivae normal.  Neck:     Musculoskeletal: Neck supple.  Cardiovascular:     Rate and Rhythm: Regular rhythm. Tachycardia present.     Heart sounds: No murmur.  Pulmonary:     Effort: Pulmonary effort is normal. No respiratory distress.     Breath sounds: Normal breath sounds.  Abdominal:     Palpations: Abdomen is soft.     Tenderness: There is no abdominal tenderness.  Skin:    General: Skin is warm  and dry.  Neurological:     Mental Status: She is alert.  Psychiatric:        Mood and Affect: Mood is anxious.      ED Treatments / Results  Labs (all labs ordered are listed, but only abnormal results are displayed) Labs Reviewed  BASIC METABOLIC PANEL - Abnormal; Notable for the following components:      Result Value   Potassium 3.4 (*)    CO2 20 (*)    All other components within normal limits  CBC  I-STAT BETA HCG BLOOD, ED ([facility], [facility], [facility] ONLY)  TROPONIN I (HIGH SENSITIVITY)  TROPONIN I (HIGH SENSITIVITY)    EKG EKG Interpretation  Date/Time:  Saturday May 19 2019 17:52:29 EDT Ventricular Rate:  102 PR Interval:  148 QRS Duration: 86 QT Interval:  336 QTC Calculation: 437 R Axis:   66 Text Interpretation:  Sinus tachycardia Right atrial enlargement Borderline ECG Confirmed by Virgina NorfolkAdam, [place] [phone]([id number]) on 05/19/2019 6:22:24 PM   Radiology Dg Chest 2 View  Result Date: 05/19/2019 CLINICAL DATA:  Left chest pain for 1 week. EXAM: CHEST - 2 VIEW COMPARISON:  05/15/2015 FINDINGS: The heart size and mediastinal contours are within normal limits. Both lungs are clear. The visualized skeletal structures are unremarkable. IMPRESSION: No active cardiopulmonary disease. Electronically Signed   By: Signa Kellaylor  Stroud M.D.   On: 05/19/2019 18:17    Procedures Procedures (including critical care time)  Medications Ordered in ED Medications  sodium chloride flush (NS) 0.9 % injection 3 mL (has no administration in time range)     Initial Impression / Assessment and Plan / ED Course  I have reviewed the triage vital signs and the nursing notes.  Pertinent labs & imaging results that were available during my care of the patient were reviewed by me and considered in my medical decision making (see chart for details).        Ms. Alice Wagner is a 38 year old female with no significant medical problems.  I reviewed her medical record.  She is here today with anxiety and chest pain.   Exam and history most consistent with costochondritis as she has unremarkable chest x-ray, and point tenderness at the rib/sternal junction.  Well-appearing.  Benign work-up including normal troponin, EKG, and basic screening labs.  She is not pregnant.  Doubt PE.  PERC negative.  Overall appropriate for outpatient follow-up regarding her anxiety due to loss of her grandmother and job insecurity.  Patient vocalized understanding and agreement with plan. Hand no other questions or concerns. Was given relevant verbal and written information regarding aftercare and return precautions and discharged in good condition.    Final Clinical Impressions(s) / ED Diagnoses   Final  diagnoses:  Costochondritis, acute  Anxiety disorder, unspecified type  Grief reaction    ED Discharge Orders    None       Jaclynn MajorStarnes, Travon Crochet, MD 05/19/19 1926    Virgina Norfolkuratolo, Adam, DO 05/19/19 2354

## 2019-05-19 NOTE — ED Provider Notes (Signed)
I have personally seen and examined the patient. I have reviewed the documentation on PMH/FH/Soc Hx. I have discussed the plan of care with the resident and patient.  I have reviewed and agree with the resident's documentation. Please see associated encounter note.  Briefly, the patient is a 38 y.o. female here with chest pain, anxiety.  Patient with point tenderness on the left side of her ribs.  Most consistent with costochondritis.  EKG unremarkable.  Troponin normal.  Chest x-ray without any signs of pneumonia, pneumothorax, pleural effusion.  Doubt PE.  Overall recommend Tylenol, Motrin for pain.  Lab work showed no significant anemia, electrolyte abnormality, kidney injury.  Discharged in the ED in good condition.  This chart was dictated using voice recognition software.  Despite best efforts to proofread,  errors can occur which can change the documentation meaning.    EKG Interpretation  Date/Time:  Saturday May 19 2019 17:52:29 EDT Ventricular Rate:  102 PR Interval:  148 QRS Duration: 86 QT Interval:  336 QTC Calculation: 437 R Axis:   66 Text Interpretation:  Sinus tachycardia Right atrial enlargement Borderline ECG Confirmed by Lennice Sites [phone]) on 05/19/2019 6:22:24 PM         Lennice Sites, DO 05/19/19 1945

## 2019-05-19 NOTE — ED Triage Notes (Signed)
Pt reports chest pain for the past few weeks, states she feels like there is a knot there. Pt anxious and tearful in triage. No other symptoms.

## 2019-06-30 ENCOUNTER — Emergency Department (HOSPITAL_COMMUNITY)
Admission: EM | Admit: 2019-06-30 | Discharge: 2019-06-30 | Disposition: A | Discharge status: Home / Self Care | Payer: Medicaid Other | Attending: Emergency Medicine | Admitting: Emergency Medicine

## 2019-06-30 ENCOUNTER — Other Ambulatory Visit: Payer: Self-pay

## 2019-06-30 ENCOUNTER — Encounter (HOSPITAL_COMMUNITY): Payer: Self-pay | Admitting: Emergency Medicine

## 2019-06-30 DIAGNOSIS — R0789 Other chest pain: Secondary | ICD-10-CM | POA: Insufficient documentation

## 2019-06-30 DIAGNOSIS — R51 Headache: Secondary | ICD-10-CM | POA: Insufficient documentation

## 2019-06-30 DIAGNOSIS — R079 Chest pain, unspecified: Secondary | ICD-10-CM

## 2019-06-30 DIAGNOSIS — Z5321 Procedure and treatment not carried out due to patient leaving prior to being seen by health care provider: Secondary | ICD-10-CM | POA: Insufficient documentation

## 2019-06-30 DIAGNOSIS — R519 Headache, unspecified: Secondary | ICD-10-CM

## 2019-06-30 LAB — BASIC METABOLIC PANEL
Anion gap: 10 (ref 5–15)
BUN: 7 mg/dL (ref 6–20)
CO2: 23 mmol/L (ref 22–32)
Calcium: 9.3 mg/dL (ref 8.9–10.3)
Chloride: 104 mmol/L (ref 98–111)
Creatinine, Ser: 0.69 mg/dL (ref 0.44–1.00)
GFR calc Af Amer: >60 mL/min (ref >60)
GFR calc non Af Amer: >60 mL/min (ref >60)
Glucose, Bld: 100 mg/dL — ABNORMAL HIGH (ref 70–99)
Potassium: 3.6 mmol/L (ref 3.5–5.1)
Sodium: 137 mmol/L (ref 135–145)

## 2019-06-30 LAB — CBC
HCT: 39.7 % (ref 36.0–46.0)
Hemoglobin: 13.1 g/dL (ref 12.0–15.0)
MCH: 27.6 pg (ref 26.0–34.0)
MCHC: 33 g/dL (ref 30.0–36.0)
MCV: 83.6 fL (ref 80.0–100.0)
Platelets: 317 10*3/uL (ref 150–400)
RBC: 4.75 MIL/uL (ref 3.87–5.11)
RDW: 13.8 % (ref 11.5–15.5)
WBC: 8.2 10*3/uL (ref 4.0–10.5)
nRBC: 0 % (ref 0.0–0.2)

## 2019-06-30 LAB — TROPONIN I (HIGH SENSITIVITY): Troponin I (High Sensitivity): 2 ng/L (ref <18)

## 2019-06-30 LAB — I-STAT BETA HCG BLOOD, ED (MC, WL, AP ONLY): I-stat hCG, quantitative: <5 m[IU]/mL (ref <5)

## 2019-06-30 NOTE — Discharge Instructions (Addendum)
You have been seen today for chest pain and headache during pregnancy.  Was unable to complete my work-up.  I discussed with you the possibility that you could have something called a pulmonary embolus which can be life-threatening.  If you are able to return I do encourage you to come back to complete your work-up.  You may take Tylenol for your headache.  Follow-up with an OB/GYN provider.

## 2019-06-30 NOTE — ED Triage Notes (Signed)
Patient c/o chest pain x 1 month, states she has been seen for it before but pain has not improved. Left chest pain with no radiation. Also c/o headache x 2 days.

## 2019-06-30 NOTE — ED Notes (Signed)
Pt leaving AMA. EDPA spoke with pt. Pt aware of risks.

## 2019-06-30 NOTE — ED Provider Notes (Signed)
[place] Provider Note   CSN: [id number] Arrival date & time: 04/16/17  1456     History   Chief Complaint Chief Complaint  Patient presents with  . Wrist Pain    HPI Alice Wagner is a 38 y.o. female.  Who presents emergency department with chief complaint of chest pain and headache.  The history is limited secondary to the fact that the patient is asked to leave immediately due to childcare issues.  The patient does tell me that she predominately came in because she has had a headache that has been persistent, moderate without neurologic deficit.  She states she is pregnant and had a positive pregnancy test recently that she always gets a headache when she is pregnant.  She took Tylenol but has not had a lot of relief.  She wanted know if there is anything else she can do.  She is also having chest pain which she has been seen for here in the past several times.  She states that it has been persistent for an entire month and has not worsened.  She denies any active pressure, hemoptysis or shortness of breath.     HPI  Past Medical History:  Diagnosis Date  . Ganglion of right wrist   . Right wrist pain   . Wears glasses     There are no active problems to display for this patient.   Past Surgical History:  Procedure Laterality Date  . CESAREAN SECTION  2012;  10-08-2009 ;  01-07-2005  . WRIST ARTHROSCOPY Right 05/29/2015   Procedure: RIGHT WRIST ARTHROSCOPY DEBRIDEMENT, ARTHROSCOPIC GANGLION CYST DECOMPRESSION;  Surgeon: Iran Planas, MD;  Location: [place];  Service: Orthopedics;  Laterality: Right;     OB History    Gravida  1   Para      Term      Preterm      AB      Living        SAB      TAB      Ectopic      Multiple      Live Births               Home Medications    Prior to Admission medications   Not on File    Family History History reviewed. No pertinent family  history.  Social History Social History   Tobacco Use  . Smoking status: Former Smoker    Years: 6.00    Types: Cigarettes    Quit date: 05/22/2013    Years since quitting: 6.1  . Smokeless tobacco: Never Used  Substance Use Topics  . Alcohol use: No  . Drug use: No     Allergies   Aloe and Aspirin   Review of Systems Review of Systems Ten systems reviewed and are negative for acute change, except as noted in the HPI.    Physical Exam Updated Vital Signs BP 115/77 (BP Location: Right Arm)   Pulse 75   Temp 98.6 F (37 C) (Oral)   Resp 16   LMP 04/15/2017   SpO2 100%   Physical Exam Vitals signs and nursing note reviewed.  Constitutional:      General: She is not in acute distress.    Appearance: She is well-developed. She is not diaphoretic.  HENT:     Head: Normocephalic and atraumatic.  Eyes:     General: No scleral icterus.    Conjunctiva/sclera: Conjunctivae  normal.  Neck:     Musculoskeletal: Normal range of motion.  Cardiovascular:     Rate and Rhythm: Normal rate and regular rhythm.     Heart sounds: Normal heart sounds. No murmur. No friction rub. No gallop.   Pulmonary:     Effort: Pulmonary effort is normal. No respiratory distress.     Breath sounds: Normal breath sounds.  Abdominal:     General: Bowel sounds are normal. There is no distension.     Palpations: Abdomen is soft. There is no mass.     Tenderness: There is no abdominal tenderness. There is no guarding.  Skin:    General: Skin is warm and dry.  Neurological:     Mental Status: She is alert and oriented to person, place, and time.  Psychiatric:        Behavior: Behavior normal.      ED Treatments / Results  Labs (all labs ordered are listed, but only abnormal results are displayed) Labs Reviewed - No data to display  EKG None  Radiology No results found.  Procedures Procedures (including critical care time)  Medications Ordered in ED Medications - No data to  display   Initial Impression / Assessment and Plan / ED Course  I have reviewed the triage vital signs and the nursing notes.  Pertinent labs & imaging results that were available during my care of the patient were reviewed by me and considered in my medical decision making (see chart for details).        Date: 06/30/2019 Patient: Alice Wagner Admitted: 04/16/2017  3:17 PM Attending Provider: Rubin Payor MD, Darryl Lent or her authorized caregiver has made the decision for the patient to leave the emergency department against the advice of Arthor Captain, PA-C She or her authorized caregiver has been informed and understands the inherent risks, including death.  Specifically I discussed the fact that her heart rate is elevated and in pregnancy she has a high risk of pulmonary embolus and had not had a d-dimer yet.  I invited the patient to return if she was able to obtain childcare.  She or her authorized caregiver has decided to accept the responsibility for this decision. Alice Wagner and all necessary parties have been advised that she may return for further evaluation or treatment. Her condition at time of discharge was Good.  Alice Wagner had current vital signs as follows:  Blood pressure 115/77, pulse 75, temperature 98.6 F (37 C), temperature source Oral, resp. rate 16, last menstrual period 04/15/2017, SpO2 100 %.   Alice Wagner or her authorized caregiver has signed the Leaving Against Medical Advice form prior to leaving the department.  Arthor Captain 06/30/2019    Final Clinical Impressions(s) / ED Diagnoses   Final diagnoses:  Right wrist pain  Scabies    ED Discharge Orders         Ordered    Hydrocodone-Acetaminophen (VICODIN) 5-300 MG TABS  4 times daily PRN,   Status:  Discontinued    Note to Pharmacy: MAY USE 5/325 IF NEEDED   04/16/17 1541    permethrin (ELIMITE) 5 % cream  Status:  Discontinued     04/16/17 1541     Hydrocodone-Acetaminophen (VICODIN) 5-300 MG TABS  4 times daily PRN,   Status:  Discontinued    Note to Pharmacy: MAY USE 5/325 IF NEEDED   04/16/17 1544           Arthor Captain, PA-C 06/30/19 2014  Benjiman CorePickering, Nathan, MD 06/30/19 [phone]

## 2019-07-16 ENCOUNTER — Other Ambulatory Visit: Payer: Self-pay

## 2019-07-16 ENCOUNTER — Encounter (HOSPITAL_COMMUNITY): Payer: Self-pay

## 2019-07-16 ENCOUNTER — Inpatient Hospital Stay (HOSPITAL_COMMUNITY)
Admission: AD | Admit: 2019-07-16 | Discharge: 2019-07-16 | Disposition: A | Discharge status: Home / Self Care | Payer: Medicaid Other | Attending: Obstetrics & Gynecology | Admitting: Obstetrics & Gynecology

## 2019-07-16 DIAGNOSIS — N939 Abnormal uterine and vaginal bleeding, unspecified: Secondary | ICD-10-CM | POA: Diagnosis present

## 2019-07-16 DIAGNOSIS — R252 Cramp and spasm: Secondary | ICD-10-CM | POA: Insufficient documentation

## 2019-07-16 LAB — POCT PREGNANCY, URINE: Preg Test, Ur: NEGATIVE

## 2019-07-16 NOTE — MAU Note (Signed)
Pt given information about women's clinics so she can be further evaluated for bleeding.

## 2019-07-16 NOTE — MAU Note (Signed)
Pt reports spotting since last night. This morning she felt "a big clump" of something come out and has been cramping. Thinks LMP was June.

## 2019-07-16 NOTE — MAU Provider Note (Signed)
S Ms. Alice Wagner is a 38 y.o. [phone] non-pregnant female who presents to MAU today with complaint of abnormal vaginal bleeding. Says she has not had a period in a few months and all of a sudden she started having a heavy period. She denies dizziness.   O BP 122/89 (BP Location: Right Arm)   Pulse (!) 126   Temp 98.1 F (36.7 C) (Oral)   Resp 18   LMP 05/18/2019 (Within Months) Comment: June 2020  SpO2 100% Comment: room air  Breastfeeding Unknown  Physical Exam  A Non pregnant female Medical screening exam complete Abnormal vaginal bleeding    P Discharge from MAU in stable condition Patient given the option of transfer to [facility] for further evaluation or seek care in outpatient facility of choice List of options for follow-up given  Warning signs for worsening condition that would warrant emergency follow-up discussed Patient may return to MAU as needed for pregnancy related complaints Ok to use ibuprofen over the counter as directed on the bottle.   Noni Saupe I, NP 07/16/2019 8:25 AM

## 2019-12-07 ENCOUNTER — Other Ambulatory Visit: Payer: Self-pay

## 2019-12-07 ENCOUNTER — Encounter (HOSPITAL_COMMUNITY): Payer: Self-pay

## 2019-12-07 ENCOUNTER — Emergency Department (HOSPITAL_COMMUNITY)
Admission: EM | Admit: 2019-12-07 | Discharge: 2019-12-07 | Disposition: A | Discharge status: Home / Self Care | Payer: Medicaid Other | Attending: Emergency Medicine | Admitting: Emergency Medicine

## 2019-12-07 DIAGNOSIS — Y929 Unspecified place or not applicable: Secondary | ICD-10-CM | POA: Insufficient documentation

## 2019-12-07 DIAGNOSIS — Z202 Contact with and (suspected) exposure to infections with a predominantly sexual mode of transmission: Secondary | ICD-10-CM | POA: Insufficient documentation

## 2019-12-07 DIAGNOSIS — B9689 Other specified bacterial agents as the cause of diseases classified elsewhere: Secondary | ICD-10-CM | POA: Diagnosis not present

## 2019-12-07 DIAGNOSIS — S39012A Strain of muscle, fascia and tendon of lower back, initial encounter: Secondary | ICD-10-CM | POA: Diagnosis not present

## 2019-12-07 DIAGNOSIS — Y999 Unspecified external cause status: Secondary | ICD-10-CM | POA: Diagnosis not present

## 2019-12-07 DIAGNOSIS — Z87891 Personal history of nicotine dependence: Secondary | ICD-10-CM | POA: Insufficient documentation

## 2019-12-07 DIAGNOSIS — N898 Other specified noninflammatory disorders of vagina: Secondary | ICD-10-CM | POA: Diagnosis present

## 2019-12-07 DIAGNOSIS — Y939 Activity, unspecified: Secondary | ICD-10-CM | POA: Diagnosis not present

## 2019-12-07 DIAGNOSIS — X58XXXA Exposure to other specified factors, initial encounter: Secondary | ICD-10-CM | POA: Insufficient documentation

## 2019-12-07 DIAGNOSIS — N760 Acute vaginitis: Secondary | ICD-10-CM | POA: Diagnosis not present

## 2019-12-07 DIAGNOSIS — R0781 Pleurodynia: Secondary | ICD-10-CM

## 2019-12-07 DIAGNOSIS — R0789 Other chest pain: Secondary | ICD-10-CM | POA: Diagnosis not present

## 2019-12-07 LAB — URINALYSIS, ROUTINE W REFLEX MICROSCOPIC
Bilirubin Urine: NEGATIVE
Glucose, UA: NEGATIVE mg/dL
Hgb urine dipstick: NEGATIVE
Ketones, ur: NEGATIVE mg/dL
Leukocytes,Ua: NEGATIVE
Nitrite: NEGATIVE
Protein, ur: NEGATIVE mg/dL
Specific Gravity, Urine: 1.017 (ref 1.005–1.030)
pH: 6 (ref 5.0–8.0)

## 2019-12-07 LAB — WET PREP, GENITAL
Sperm: NONE SEEN
Trich, Wet Prep: NONE SEEN
Yeast Wet Prep HPF POC: NONE SEEN

## 2019-12-07 LAB — I-STAT BETA HCG BLOOD, ED (MC, WL, AP ONLY): I-stat hCG, quantitative: <5 m[IU]/mL (ref <5)

## 2019-12-07 LAB — CBG MONITORING, ED: Glucose-Capillary: 91 mg/dL (ref 70–99)

## 2019-12-07 LAB — HIV ANTIBODY (ROUTINE TESTING W REFLEX): HIV Screen 4th Generation wRfx: NONREACTIVE

## 2019-12-07 LAB — PREGNANCY, URINE: Preg Test, Ur: NEGATIVE

## 2019-12-07 MED ORDER — DOXYCYCLINE HYCLATE 100 MG PO TABS
100.0000 mg | ORAL_TABLET | Freq: Once | ORAL | Status: AC
  Administered 2019-12-07: 100 mg via ORAL
  Filled 2019-12-07: qty 1

## 2019-12-07 MED ORDER — DOXYCYCLINE HYCLATE 100 MG PO CAPS: 100.0000 mg | ORAL_CAPSULE | Freq: Two times a day (BID) | ORAL | 0 refills | Status: AC

## 2019-12-07 MED ORDER — CEFTRIAXONE SODIUM 500 MG IJ SOLR
500.0000 mg | Freq: Once | INTRAMUSCULAR | Status: AC
  Administered 2019-12-07: 500 mg via INTRAMUSCULAR
  Filled 2019-12-07: qty 500

## 2019-12-07 MED ORDER — METRONIDAZOLE 500 MG PO TABS: 500.0000 mg | ORAL_TABLET | Freq: Two times a day (BID) | ORAL | 0 refills | Status: DC

## 2019-12-07 MED ORDER — METHOCARBAMOL 500 MG PO TABS: 500.0000 mg | ORAL_TABLET | Freq: Two times a day (BID) | ORAL | 0 refills | Status: DC

## 2019-12-07 NOTE — ED Triage Notes (Signed)
Pt reports right sided flank pain that radiates to her back that started a few days, along with frequent urination as well as vaginal discharge.

## 2019-12-07 NOTE — Discharge Instructions (Addendum)
Please take Robaxin as prescribed.  Please use Tylenol and ibuprofen for your rib/back pain.  I suspect this is muscular in origin you can also do gentle stretching and warm salt water soaks.  And warm compresses.  Your swab shows that you have bacterial vaginosis today this is not a sexually transmitted disease and is in fact a overgrowth of the bacteria in her vagina.  For this he will be taking Flagyl/metronidazole for 1 week.  He will also be taking the doxycycline for 1 week for empiric treatment of gonorrhea/chlamydia

## 2019-12-07 NOTE — ED Provider Notes (Signed)
[facility] EMERGENCY DEPARTMENT Provider Note   CSN: [id number] Arrival date & time: 12/07/19  1333     History Chief Complaint  Patient presents with  . Flank Pain  . Back Pain  . Vaginal Pain    Alice Wagner is a 39 y.o. female.  HPI  Patient is a 39 year old female with no significant past medical history presenting today for 2 days of vaginal irritation, thick vaginal discharge, vaginal discomfort, dyspareunia, as well as concern for sexually transmitted disease and she has a boyfriend to she recently found has been sleeping of other people.  She states that she had sex with him without any barrier protection.  She states that she also has some rib cage pain and right-sided back pain which began several days ago.  She states that the pain is achy and worse with movement and lifting.  Patient states she also has been peeing more frequently last few days and was concerned that she may have a UTI.  Patient denies any excessive thirst or weight changes.  She denies any fevers, chills, cough cold congestion.  She denies any abdominal pain, chest pain nausea or vomiting.      Past Medical History:  Diagnosis Date  . Ganglion of right wrist   . Right wrist pain   . Wears glasses     There are no problems to display for this patient.   Past Surgical History:  Procedure Laterality Date  . CESAREAN SECTION  2012;  10-08-2009 ;  01-07-2005  . WRIST ARTHROSCOPY Right 05/29/2015   Procedure: RIGHT WRIST ARTHROSCOPY DEBRIDEMENT, ARTHROSCOPIC GANGLION CYST DECOMPRESSION;  Surgeon: Bradly Bienenstock, MD;  Location: [facility];  Service: Orthopedics;  Laterality: Right;     OB History    Gravida  4   Para  2   Term  2   Preterm      AB  2   Living  2     SAB  2   TAB      Ectopic      Multiple      Live Births  2           No family history on file.  Social History   Tobacco Use  . Smoking status: Former Smoker   Years: 6.00    Types: Cigarettes    Quit date: 05/22/2013    Years since quitting: 6.5  . Smokeless tobacco: Never Used  Substance Use Topics  . Alcohol use: No  . Drug use: No    Home Medications Prior to Admission medications   Medication Sig Start Date End Date Taking? Authorizing Provider  doxycycline (VIBRAMYCIN) 100 MG capsule Take 1 capsule (100 mg total) by mouth 2 (two) times daily for 7 days. 12/07/19 12/14/19  Gailen Shelter, PA  methocarbamol (ROBAXIN) 500 MG tablet Take 1 tablet (500 mg total) by mouth 2 (two) times daily. 12/07/19   Gailen Shelter, PA  metroNIDAZOLE (FLAGYL) 500 MG tablet Take 1 tablet (500 mg total) by mouth 2 (two) times daily. 12/07/19   Gailen Shelter, PA    Allergies    Aloe and Aspirin  Review of Systems   Review of Systems  Constitutional: Negative for chills and fever.  HENT: Negative for congestion.   Eyes: Negative for pain.  Respiratory: Negative for cough and shortness of breath.   Cardiovascular: Negative for chest pain and leg swelling.  Gastrointestinal: Negative for abdominal pain, diarrhea, nausea and vomiting.  Genitourinary: Positive for flank pain, frequency, vaginal discharge and vaginal pain. Negative for dysuria, hematuria and pelvic pain.  Musculoskeletal: Negative for myalgias.  Skin: Negative for rash.  Neurological: Negative for dizziness and headaches.    Physical Exam Updated Vital Signs BP 124/89 (BP Location: Right Arm)   Pulse 83   Temp 98.2 F (36.8 C) (Oral)   Resp 16   LMP 11/16/2019   SpO2 98%   Physical Exam Vitals and nursing note reviewed.  Constitutional:      General: She is not in acute distress. HENT:     Head: Normocephalic and atraumatic.     Nose: Nose normal.     Mouth/Throat:     Mouth: Mucous membranes are moist.  Eyes:     General: No scleral icterus. Cardiovascular:     Rate and Rhythm: Normal rate and regular rhythm.     Pulses: Normal pulses.     Heart sounds: Normal heart  sounds.  Pulmonary:     Effort: Pulmonary effort is normal. No respiratory distress.     Breath sounds: No wheezing.  Abdominal:     Palpations: Abdomen is soft.     Tenderness: There is no abdominal tenderness. There is no right CVA tenderness or left CVA tenderness.  Genitourinary:    Comments: Vulva without lesions or abnormality Vaginal canal without abnormal lesion; scant whitish discharge present in vaginal vault Cervix appears normal, is closed No adnexal tenderness or CMT Musculoskeletal:       Arms:     Cervical back: Normal range of motion.     Right lower leg: No edema.     Left lower leg: No edema.  Skin:    General: Skin is warm and dry.     Capillary Refill: Capillary refill takes less than 2 seconds.  Neurological:     Mental Status: She is alert. Mental status is at baseline.  Psychiatric:        Mood and Affect: Mood normal.        Behavior: Behavior normal.     ED Results / Procedures / Treatments   Labs (all labs ordered are listed, but only abnormal results are displayed) Labs Reviewed  WET PREP, GENITAL - Abnormal; Notable for the following components:      Result Value   Clue Cells Wet Prep HPF POC PRESENT (*)    WBC, Wet Prep HPF POC MANY (*)    All other components within normal limits  URINALYSIS, ROUTINE W REFLEX MICROSCOPIC - Abnormal; Notable for the following components:   Color, Urine STRAW (*)    All other components within normal limits  PREGNANCY, URINE  RPR  HIV ANTIBODY (ROUTINE TESTING W REFLEX)  I-STAT BETA HCG BLOOD, ED ([facility], [facility], [facility] ONLY)  CBG MONITORING, ED  GC/CHLAMYDIA PROBE AMP ([place]) NOT AT [facility]    EKG None  Radiology No results found.  Procedures Procedures (including critical care time)  Medications Ordered in ED Medications  cefTRIAXone (ROCEPHIN) injection 500 mg (500 mg Intramuscular Given 12/07/19 1645)  doxycycline (VIBRA-TABS) tablet 100 mg (100 mg Oral Given 12/07/19 1645)    ED Course  I have  reviewed the triage vital signs and the nursing notes.  Pertinent labs & imaging results that were available during my care of the patient were reviewed by me and considered in my medical decision making (see chart for details).  Patient is well-appearing 39 year old female with no significant past medical history presented for concern for gonorrhea chlamydia  after she found out that her boyfriend has been cheating on her.  [place].  She states that she has had a productive vaginal sex with her boyfriend up until 2 days ago when she discovered his infidelity.  She states that she has had some vaginal discharge for couple days and is concerned that she has an STD.  She like to be checked for all STDs.  She states that she has right rib and right back pain.  She denies any urinary symptoms to indicate that she could have pyelonephritis.  Her physical exam is significant for reproducible MSK tenderness to palpation of right lumbar musculature.  She has no right upper quadrant tenderness to palpation therefore I doubt Fitz-Hugh Curtis syndrome or any intra-abdominal cause of the patient's symptoms today.  I suspect she has a muscular injury/spasm of her right lower back and flank which is causing her symptoms.  I suspect she also has BV or a sexually transmitted disease causing her pelvic symptoms.  We will treat empirically today and obtain swabs for STD testing as well as HIV and syphilis.  Pelvic exam is generally unremarkable although there is scant watery discharge.    Her wet prep is significant for clue cells and WBCs consistent with bacterial vaginosis.  We will treat for 1 week with Flagyl.  She was treated in ED empirically for GC chlamydia with Rocephin 5 mg and discharged with doxycycline for 1 week as well.  CBG obtained as patient states that she has been urinating more frequently it is within normal limits.  I stated she is you negative she is not pregnant and urinalysis is unremarkable no  evidence of infection.  I discussed results of all of the patient's lab testing with the patient.  She understands that she will take 2 antibiotics for 1 week.     MDM Rules/Calculators/A&P                      Final Clinical Impression(s) / ED Diagnoses Final diagnoses:  Possible exposure to STD  Bacterial vaginitis  Rib pain  Back strain, initial encounter    Rx / DC Orders ED Discharge Orders         Ordered    metroNIDAZOLE (FLAGYL) 500 MG tablet  2 times daily     12/07/19 1643    doxycycline (VIBRAMYCIN) 100 MG capsule  2 times daily     12/07/19 1643    methocarbamol (ROBAXIN) 500 MG tablet  2 times daily     12/07/19 1643           Pati Gallo [place], [place] 12/07/19 1649    Davonna Belling, MD 12/08/19 2030

## 2019-12-07 NOTE — ED Notes (Signed)
Pelvic by pa  Specimens to the lab

## 2019-12-08 LAB — SYPHILIS: RPR W/REFLEX TO RPR TITER AND TREPONEMAL ANTIBODIES, TRADITIONAL SCREENING AND DIAGNOSIS ALGORITHM: RPR Ser Ql: NONREACTIVE

## 2019-12-11 LAB — GC/CHLAMYDIA PROBE AMP (~~LOC~~) NOT AT ARMC
Chlamydia: NEGATIVE
Neisseria Gonorrhea: NEGATIVE

## 2020-01-31 ENCOUNTER — Ambulatory Visit (HOSPITAL_COMMUNITY)
Admission: EM | Admit: 2020-01-31 | Discharge: 2020-01-31 | Disposition: A | Discharge status: Home / Self Care | Payer: Medicaid Other | Attending: Family Medicine | Admitting: Family Medicine

## 2020-01-31 ENCOUNTER — Encounter (HOSPITAL_COMMUNITY): Payer: Self-pay

## 2020-01-31 ENCOUNTER — Other Ambulatory Visit: Payer: Self-pay

## 2020-01-31 DIAGNOSIS — M722 Plantar fascial fibromatosis: Secondary | ICD-10-CM | POA: Diagnosis not present

## 2020-01-31 MED ORDER — PREDNISONE 10 MG (21) PO TBPK: ORAL_TABLET | ORAL | 0 refills | Status: DC

## 2020-01-31 NOTE — Discharge Instructions (Addendum)
This is most likely plantar fasciitis.  Treating with prednisone taper.  Rest, ice and elevate the feet. We will give you a contact for podiatry for follow-up as needed. After the prednisone taper is finished you can start with 600 ibuprofen if you need more pain relief

## 2020-01-31 NOTE — ED Triage Notes (Signed)
Pt presents with ongoing bilateral foot pain from unknown source.

## 2020-01-31 NOTE — ED Provider Notes (Signed)
[facility]    CSN: [id number] Arrival date & time: 01/31/20  2119      History   Chief Complaint Chief Complaint  Patient presents with  . Foot Pain    Bilateral      HPI Alice Wagner is a 39 y.o. female.   Patient is a 38-year female presents today with bilateral foot pain.  The foot pain is been ongoing issue, waxing waning over the past couple months.  The pain has worsened this past week.  The pain is located to the plantar aspects of both feet and some into the lateral aspect of foot.  There has been mild swelling.  No erythema, increased warmth or bruising.  No injuries to the foot.  No numbness, tingling.  Denies any prolonged standing, increased walking or running.  Reporting she wears pretty supportive shoes to work due to high arch.  ROS per HPI      Past Medical History:  Diagnosis Date  . Ganglion of right wrist   . Right wrist pain   . Wears glasses     There are no problems to display for this patient.   Past Surgical History:  Procedure Laterality Date  . CESAREAN SECTION  2012;  10-08-2009 ;  01-07-2005  . WRIST ARTHROSCOPY Right 05/29/2015   Procedure: RIGHT WRIST ARTHROSCOPY DEBRIDEMENT, ARTHROSCOPIC GANGLION CYST DECOMPRESSION;  Surgeon: Bradly Bienenstock, MD;  Location: [facility];  Service: Orthopedics;  Laterality: Right;    OB History    Gravida  4   Para  2   Term  2   Preterm      AB  2   Living  2     SAB  2   TAB      Ectopic      Multiple      Live Births  2            Home Medications    Prior to Admission medications   Medication Sig Start Date End Date Taking? Authorizing Provider  methocarbamol (ROBAXIN) 500 MG tablet Take 1 tablet (500 mg total) by mouth 2 (two) times daily. 12/07/19   Gailen Shelter, PA  predniSONE (STERAPRED UNI-PAK 21 TAB) 10 MG (21) TBPK tablet 6 tabs for 1 day, then 5 tabs for 1 das, then 4 tabs for 1 day, then 3 tabs for 1 day, 2 tabs for 1 day, then 1 tab  for 1 day 01/31/20   Janace Aris, NP    Family History History reviewed. No pertinent family history.  Social History Social History   Tobacco Use  . Smoking status: Former Smoker    Years: 6.00    Types: Cigarettes    Quit date: 05/22/2013    Years since quitting: 6.6  . Smokeless tobacco: Never Used  Substance Use Topics  . Alcohol use: No  . Drug use: No     Allergies   Aloe and Aspirin   Review of Systems Review of Systems   Physical Exam Triage Vital Signs ED Triage Vitals  Enc Vitals Group     BP 01/31/20 0916 122/78     Pulse Rate 01/31/20 0916 86     Resp 01/31/20 0916 16     Temp 01/31/20 0916 98.6 F (37 C)     Temp Source 01/31/20 0916 Oral     SpO2 01/31/20 0916 100 %     Weight --      Height --  Head Circumference --      Peak Flow --      Pain Score 01/31/20 0930 9     Pain Loc --      Pain Edu? --      Excl. in [place]? --    No data found.  Updated Vital Signs BP 122/78 (BP Location: Left Arm)   Pulse 86   Temp 98.6 F (37 C) (Oral)   Resp 16   SpO2 100%   Visual Acuity Right Eye Distance:   Left Eye Distance:   Bilateral Distance:    Right Eye Near:   Left Eye Near:    Bilateral Near:     Physical Exam Vitals and nursing note reviewed.  Constitutional:      General: She is not in acute distress.    Appearance: Normal appearance. She is not ill-appearing, toxic-appearing or diaphoretic.  HENT:     Head: Normocephalic.     Nose: Nose normal.  Eyes:     Conjunctiva/sclera: Conjunctivae normal.  Pulmonary:     Effort: Pulmonary effort is normal.  Musculoskeletal:        General: Normal range of motion.     Cervical back: Normal range of motion.       Feet:     Comments: TTP with mild swelling  No erythema, bruising or deformities.  2+ bilateral pedal pulses Sensation intact   Skin:    General: Skin is warm and dry.     Findings: No rash.  Neurological:     Mental Status: She is alert.  Psychiatric:        Mood  and Affect: Mood normal.      UC Treatments / Results  Labs (all labs ordered are listed, but only abnormal results are displayed) Labs Reviewed - No data to display  EKG   Radiology No results found.  Procedures Procedures (including critical care time)  Medications Ordered in UC Medications - No data to display  Initial Impression / Assessment and Plan / UC Course  I have reviewed the triage vital signs and the nursing notes.  Pertinent labs & imaging results that were available during my care of the patient were reviewed by me and considered in my medical decision making (see chart for details).     Plantar fasciitis bilateral-treatment prednisone taper pack 6 days. We will have her rest, ice, elevate the feet. Podiatry contact given for follow-up Recommended possibly trying better supportive shoes with inserts Final Clinical Impressions(s) / UC Diagnoses   Final diagnoses:  Plantar fasciitis, bilateral     Discharge Instructions     This is most likely plantar fasciitis.  Treating with prednisone taper.  Rest, ice and elevate the feet. We will give you a contact for podiatry for follow-up as needed. After the prednisone taper is finished you can start with 600 ibuprofen if you need more pain relief      ED Prescriptions    Medication Sig Dispense Auth. Provider   predniSONE (STERAPRED UNI-PAK 21 TAB) 10 MG (21) TBPK tablet 6 tabs for 1 day, then 5 tabs for 1 das, then 4 tabs for 1 day, then 3 tabs for 1 day, 2 tabs for 1 day, then 1 tab for 1 day 21 tablet Irie Dowson A, NP     PDMP not reviewed this encounter.   Orvan July, NP 01/31/20 1215

## 2020-04-05 ENCOUNTER — Emergency Department (HOSPITAL_COMMUNITY): Payer: Medicaid Other

## 2020-04-05 ENCOUNTER — Encounter (HOSPITAL_COMMUNITY): Payer: Self-pay | Admitting: Emergency Medicine

## 2020-04-05 ENCOUNTER — Other Ambulatory Visit: Payer: Self-pay

## 2020-04-05 ENCOUNTER — Emergency Department (HOSPITAL_COMMUNITY)
Admission: EM | Admit: 2020-04-05 | Discharge: 2020-04-05 | Disposition: A | Discharge status: Home / Self Care | Payer: Medicaid Other | Attending: Emergency Medicine | Admitting: Emergency Medicine

## 2020-04-05 DIAGNOSIS — Y939 Activity, unspecified: Secondary | ICD-10-CM | POA: Diagnosis not present

## 2020-04-05 DIAGNOSIS — Z87891 Personal history of nicotine dependence: Secondary | ICD-10-CM | POA: Diagnosis not present

## 2020-04-05 DIAGNOSIS — W2209XA Striking against other stationary object, initial encounter: Secondary | ICD-10-CM | POA: Insufficient documentation

## 2020-04-05 DIAGNOSIS — Z79899 Other long term (current) drug therapy: Secondary | ICD-10-CM | POA: Insufficient documentation

## 2020-04-05 DIAGNOSIS — Y999 Unspecified external cause status: Secondary | ICD-10-CM | POA: Diagnosis not present

## 2020-04-05 DIAGNOSIS — S39012A Strain of muscle, fascia and tendon of lower back, initial encounter: Secondary | ICD-10-CM

## 2020-04-05 DIAGNOSIS — Y929 Unspecified place or not applicable: Secondary | ICD-10-CM | POA: Diagnosis not present

## 2020-04-05 DIAGNOSIS — R1032 Left lower quadrant pain: Secondary | ICD-10-CM | POA: Insufficient documentation

## 2020-04-05 DIAGNOSIS — S3992XA Unspecified injury of lower back, initial encounter: Secondary | ICD-10-CM | POA: Diagnosis present

## 2020-04-05 LAB — I-STAT BETA HCG BLOOD, ED (MC, WL, AP ONLY): I-stat hCG, quantitative: <5 m[IU]/mL (ref <5)

## 2020-04-05 MED ORDER — DIAZEPAM 5 MG PO TABS
5.0000 mg | ORAL_TABLET | Freq: Once | ORAL | Status: AC
  Administered 2020-04-05: 5 mg via ORAL
  Filled 2020-04-05: qty 1

## 2020-04-05 MED ORDER — ACETAMINOPHEN 500 MG PO TABS
1000.0000 mg | ORAL_TABLET | Freq: Once | ORAL | Status: AC
  Administered 2020-04-05: 1000 mg via ORAL
  Filled 2020-04-05: qty 2

## 2020-04-05 MED ORDER — KETOROLAC TROMETHAMINE 60 MG/2ML IM SOLN
15.0000 mg | Freq: Once | INTRAMUSCULAR | Status: AC
  Administered 2020-04-05: 15 mg via INTRAMUSCULAR
  Filled 2020-04-05: qty 2

## 2020-04-05 MED ORDER — OXYCODONE HCL 5 MG PO TABS
5.0000 mg | ORAL_TABLET | Freq: Once | ORAL | Status: AC
  Administered 2020-04-05: 5 mg via ORAL
  Filled 2020-04-05: qty 1

## 2020-04-05 NOTE — Discharge Instructions (Signed)

## 2020-04-05 NOTE — ED Triage Notes (Addendum)
C/o pain to L side of back.  States a large box of lard fell on L side of back when she walked by it at work just pta.  PT crying.

## 2020-04-05 NOTE — ED Provider Notes (Signed)
[facility] Provider Note   CSN: [id number] Arrival date & time: 04/05/20  1157     History Chief Complaint  Patient presents with  . Back Pain    Alice Wagner is a 39 y.o. female.  39 yo F with a chief complaints of left-sided low back pain.  Patient states that she was at work at a heavy box bumped against her low back.  She feels that it radiates around to her abdomen.  Denies other area of injury.  Denies numbness or weakness of the leg.  Denies loss of bowel or bladder.  Does have a history of low back pain.  Feels that this feels somewhat similar.  The history is provided by the patient.  Back Pain Location:  Lumbar spine Quality:  Shooting and stabbing Radiates to: LLQ. Pain severity:  Moderate Onset quality:  Gradual Duration:  2 hours Timing:  Constant Progression:  Unchanged Chronicity:  New Context comment:  Blunt trauma Relieved by:  Nothing Worsened by:  Bending, movement and palpation Ineffective treatments:  None tried Associated symptoms: no chest pain, no dysuria, no fever and no headaches        Past Medical History:  Diagnosis Date  . Ganglion of right wrist   . Right wrist pain   . Wears glasses     There are no problems to display for this patient.   Past Surgical History:  Procedure Laterality Date  . CESAREAN SECTION  2012;  10-08-2009 ;  01-07-2005  . WRIST ARTHROSCOPY Right 05/29/2015   Procedure: RIGHT WRIST ARTHROSCOPY DEBRIDEMENT, ARTHROSCOPIC GANGLION CYST DECOMPRESSION;  Surgeon: Iran Planas, MD;  Location: [facility];  Service: Orthopedics;  Laterality: Right;     OB History    Gravida  4   Para  2   Term  2   Preterm      AB  2   Living  2     SAB  2   TAB      Ectopic      Multiple      Live Births  2           No family history on file.  Social History   Tobacco Use  . Smoking status: Former Smoker    Years: 6.00    Types: Cigarettes      Quit date: 05/22/2013    Years since quitting: 6.8  . Smokeless tobacco: Never Used  Vaping Use  . Vaping Use: Never used  Substance Use Topics  . Alcohol use: No  . Drug use: No    Home Medications Prior to Admission medications   Medication Sig Start Date End Date Taking? Authorizing Provider  methocarbamol (ROBAXIN) 500 MG tablet Take 1 tablet (500 mg total) by mouth 2 (two) times daily. 12/07/19   Tedd Sias, PA  predniSONE (STERAPRED UNI-PAK 21 TAB) 10 MG (21) TBPK tablet 6 tabs for 1 day, then 5 tabs for 1 das, then 4 tabs for 1 day, then 3 tabs for 1 day, 2 tabs for 1 day, then 1 tab for 1 day 01/31/20   Loura Halt A, NP    Allergies    Aloe and Aspirin  Review of Systems   Review of Systems  Constitutional: Negative for chills and fever.  HENT: Negative for congestion and rhinorrhea.   Eyes: Negative for redness and visual disturbance.  Respiratory: Negative for shortness of breath and wheezing.   Cardiovascular: Negative for chest  pain and palpitations.  Gastrointestinal: Negative for nausea and vomiting.  Genitourinary: Negative for dysuria and urgency.  Musculoskeletal: Positive for back pain. Negative for arthralgias and myalgias.  Skin: Negative for pallor and wound.  Neurological: Negative for dizziness and headaches.    Physical Exam Updated Vital Signs BP (!) 137/99 (BP Location: Right Arm)   Pulse 90   Temp 98 F (36.7 C) (Oral)   Resp 16   LMP 03/29/2020   SpO2 100%   Physical Exam Vitals and nursing note reviewed.  Constitutional:      General: She is not in acute distress.    Appearance: She is well-developed. She is not diaphoretic.  HENT:     Head: Normocephalic and atraumatic.  Eyes:     Pupils: Pupils are equal, round, and reactive to light.  Cardiovascular:     Rate and Rhythm: Normal rate and regular rhythm.     Heart sounds: No murmur heard.  No friction rub. No gallop.   Pulmonary:     Effort: Pulmonary effort is normal.      Breath sounds: No wheezing or rales.  Abdominal:     General: There is no distension.     Palpations: Abdomen is soft.     Tenderness: There is no abdominal tenderness.  Musculoskeletal:        General: Tenderness present.     Cervical back: Normal range of motion and neck supple.     Comments: Tenderness to the muscles of the left low back.  No bony tenderness to the pelvis or the midline L-spine.  Pulse motor and sensation are intact to the left lower extremity.  Reflexes are 2+ and equal to the other side.  No clonus.  Skin:    General: Skin is warm and dry.  Neurological:     Mental Status: She is alert and oriented to person, place, and time.  Psychiatric:        Behavior: Behavior normal.     ED Results / Procedures / Treatments   Labs (all labs ordered are listed, but only abnormal results are displayed) Labs Reviewed  I-STAT BETA HCG BLOOD, ED ([facility], [facility], [facility] ONLY)    EKG None  Radiology DG Lumbar Spine Complete  Result Date: 04/05/2020 CLINICAL DATA:  Low back pain after fall at work today. EXAM: LUMBAR SPINE - COMPLETE 4+ VIEW COMPARISON:  None. FINDINGS: Vertebral body alignment, heights and disc space heights are normal. There is minimal spondylosis of the lumbar spine. No compression fracture or spondylolisthesis. Remainder of the exam is unremarkable. IMPRESSION: No acute findings. Electronically Signed   By: Elberta Fortis M.D.   On: 04/05/2020 14:26    Procedures Procedures (including critical care time)  Medications Ordered in ED Medications  acetaminophen (TYLENOL) tablet 1,000 mg (1,000 mg Oral Given 04/05/20 1302)  ketorolac (TORADOL) injection 15 mg (15 mg Intramuscular Given 04/05/20 1303)  oxyCODONE (Oxy IR/ROXICODONE) immediate release tablet 5 mg (5 mg Oral Given 04/05/20 1302)  diazepam (VALIUM) tablet 5 mg (5 mg Oral Given 04/05/20 1302)    ED Course  I have reviewed the triage vital signs and the nursing notes.  Pertinent labs & imaging results that  were available during my care of the patient were reviewed by me and considered in my medical decision making (see chart for details).    MDM Rules/Calculators/A&P  39 yo F with a cc of left-sided low back pain.  She was struck by a heavy box at work.  No signs of trauma on exam.  Benign abdominal exam.  She has mild muscular tenderness to the left paraspinal musculature.  No midline spinal tenderness.  Reflexes are 2+ and equal bilaterally she is ambulatory.  Plain film viewed due to traumatic injury without fracture is viewed by me.  We will have her treat supportively.  PCP follow-up.  2:49 PM:  I have discussed the diagnosis/risks/treatment options with the patient and believe the pt to be eligible for discharge home to follow-up with PCP. We also discussed returning to the ED immediately if new or worsening sx occur. We discussed the sx which are most concerning (e.g., sudden worsening pain, fever, inability to tolerate by mouth, cauda equina s/sx) that necessitate immediate return. Medications administered to the patient during their visit and any new prescriptions provided to the patient are listed below.  Medications given during this visit Medications  acetaminophen (TYLENOL) tablet 1,000 mg (1,000 mg Oral Given 04/05/20 1302)  ketorolac (TORADOL) injection 15 mg (15 mg Intramuscular Given 04/05/20 1303)  oxyCODONE (Oxy IR/ROXICODONE) immediate release tablet 5 mg (5 mg Oral Given 04/05/20 1302)  diazepam (VALIUM) tablet 5 mg (5 mg Oral Given 04/05/20 1302)     The patient appears reasonably screen and/or stabilized for discharge and I doubt any other medical condition or other [facility] requiring further screening, evaluation, or treatment in the ED at this time prior to discharge.   Final Clinical Impression(s) / ED Diagnoses Final diagnoses:  Strain of lumbar region, initial encounter    Rx / DC Orders ED Discharge Orders    None       Melene Plan,  DO 04/05/20 1449

## 2020-04-07 ENCOUNTER — Encounter (HOSPITAL_COMMUNITY): Payer: Self-pay

## 2020-04-07 ENCOUNTER — Emergency Department (HOSPITAL_COMMUNITY)
Admission: EM | Admit: 2020-04-07 | Discharge: 2020-04-07 | Disposition: A | Discharge status: Home / Self Care | Payer: Medicaid Other | Attending: Emergency Medicine | Admitting: Emergency Medicine

## 2020-04-07 DIAGNOSIS — M549 Dorsalgia, unspecified: Secondary | ICD-10-CM | POA: Diagnosis present

## 2020-04-07 DIAGNOSIS — Z5321 Procedure and treatment not carried out due to patient leaving prior to being seen by health care provider: Secondary | ICD-10-CM | POA: Diagnosis not present

## 2020-04-07 NOTE — ED Notes (Signed)
Pt did not respond to name X3 times for room.

## 2020-04-07 NOTE — ED Triage Notes (Signed)
Pt arrives to ED w/ c/o L sided back pain. Seen here Saturday for same. Pt denies urinary symptoms.

## 2020-04-08 ENCOUNTER — Encounter (HOSPITAL_COMMUNITY): Payer: Self-pay

## 2020-04-08 ENCOUNTER — Ambulatory Visit (HOSPITAL_COMMUNITY)
Admission: EM | Admit: 2020-04-08 | Discharge: 2020-04-08 | Disposition: A | Discharge status: Home / Self Care | Payer: Medicaid Other | Attending: Emergency Medicine | Admitting: Emergency Medicine

## 2020-04-08 ENCOUNTER — Other Ambulatory Visit: Payer: Self-pay

## 2020-04-08 DIAGNOSIS — M5442 Lumbago with sciatica, left side: Secondary | ICD-10-CM

## 2020-04-08 MED ORDER — CYCLOBENZAPRINE HCL 10 MG PO TABS: 10.0000 mg | ORAL_TABLET | Freq: Two times a day (BID) | ORAL | 0 refills | Status: DC | PRN

## 2020-04-08 MED ORDER — KETOROLAC TROMETHAMINE 60 MG/2ML IM SOLN
INTRAMUSCULAR | Status: AC
  Filled 2020-04-08: qty 2

## 2020-04-08 MED ORDER — KETOROLAC TROMETHAMINE 60 MG/2ML IM SOLN
60.0000 mg | Freq: Once | INTRAMUSCULAR | Status: AC
  Administered 2020-04-08: 60 mg via INTRAMUSCULAR

## 2020-04-08 MED ORDER — PREDNISONE 10 MG PO TABS: ORAL_TABLET | ORAL | 0 refills | Status: DC

## 2020-04-08 NOTE — Discharge Instructions (Addendum)
We gave you an injection of Toradol, this hopefully will ease off pain in approximately 30-40 minutes Begin prednisone taper over the next 6 days-begin with 6 tablets today with food, decrease by 1 tablet each day until complete-6, 5, 4, 3, 2, 1 You may use flexeril as needed to help with pain. This is a muscle relaxer and causes sedation- please use only at bedtime or when you will be home and not have to drive/work May use your hydrocodone prescribed to you as needed, I cannot provide further narcotics given you have a 30-day supply is from 6/14 Gentle stretching, avoid complete bedrest  Please follow-up if any symptoms not improving or worsening

## 2020-04-08 NOTE — ED Provider Notes (Signed)
[facility]    CSN: [id number] Arrival date & time: 04/08/20  0850      History   Chief Complaint Chief Complaint  Patient presents with  . Back Pain    HPI Alice Wagner is a 39 y.o. female presenting today for evaluation of back pain.  Patient had injury on Saturday to left lower back.  Had a heavy box fall onto her back.  Was seen in the emergency room and had negative lumbar x-ray.  She has been taking Tylenol and ibuprofen without relief.  Describes a burning sensation that slightly radiates into left leg with any movement.  Denies dysuria, hematuria, urgency.  Denies saddle anesthesia.  Denies loss of control of bowel/bladder.  HPI  Past Medical History:  Diagnosis Date  . Ganglion of right wrist   . Right wrist pain   . Wears glasses     There are no problems to display for this patient.   Past Surgical History:  Procedure Laterality Date  . CESAREAN SECTION  2012;  10-08-2009 ;  01-07-2005  . WRIST ARTHROSCOPY Right 05/29/2015   Procedure: RIGHT WRIST ARTHROSCOPY DEBRIDEMENT, ARTHROSCOPIC GANGLION CYST DECOMPRESSION;  Surgeon: Bradly Bienenstock, MD;  Location: [facility];  Service: Orthopedics;  Laterality: Right;    OB History    Gravida  4   Para  2   Term  2   Preterm      AB  2   Living  2     SAB  2   TAB      Ectopic      Multiple      Live Births  2            Home Medications    Prior to Admission medications   Medication Sig Start Date End Date Taking? Authorizing Provider  cyclobenzaprine (FLEXERIL) 10 MG tablet Take 1 tablet (10 mg total) by mouth 2 (two) times daily as needed for muscle spasms. 04/08/20   Sanaya Gwilliam C, PA-C  predniSONE (DELTASONE) 10 MG tablet Begin with 6 tabs on day 1, 5 tab on day 2, 4 tab on day 3, 3 tab on day 4, 2 tab on day 5, 1 tab on day 6-take with food 04/08/20   Kharee Lesesne, Junius Creamer, PA-C    Family History History reviewed. No pertinent family history.  Social  History Social History   Tobacco Use  . Smoking status: Former Smoker    Years: 6.00    Types: Cigarettes    Quit date: 05/22/2013    Years since quitting: 6.8  . Smokeless tobacco: Never Used  Vaping Use  . Vaping Use: Never used  Substance Use Topics  . Alcohol use: No  . Drug use: No     Allergies   Aloe and Aspirin   Review of Systems Review of Systems  Constitutional: Negative for fatigue and fever.  Eyes: Negative for visual disturbance.  Respiratory: Negative for shortness of breath.   Cardiovascular: Negative for chest pain.  Gastrointestinal: Negative for abdominal pain, nausea and vomiting.  Musculoskeletal: Positive for back pain and myalgias. Negative for arthralgias and joint swelling.  Skin: Negative for color change, rash and wound.  Neurological: Negative for dizziness, weakness, light-headedness and headaches.     Physical Exam Triage Vital Signs ED Triage Vitals  Enc Vitals Group     BP 04/08/20 0914 120/75     Pulse Rate 04/08/20 0914 (!) 105     Resp 04/08/20 0914 18  Temp 04/08/20 0914 98 F (36.7 C)     Temp Source 04/08/20 0914 Oral     SpO2 04/08/20 0914 98 %     Weight --      Height --      Head Circumference --      Peak Flow --      Pain Score 04/08/20 0917 10     Pain Loc --      Pain Edu? --      Excl. in [place]? --    No data found.  Updated Vital Signs BP 120/75 (BP Location: Right Arm)   Pulse (!) 105   Temp 98 F (36.7 C) (Oral)   Resp 18   LMP 03/29/2020   SpO2 98%   Visual Acuity Right Eye Distance:   Left Eye Distance:   Bilateral Distance:    Right Eye Near:   Left Eye Near:    Bilateral Near:     Physical Exam Vitals and nursing note reviewed.  Constitutional:      Appearance: She is well-developed.     Comments: No acute distress  HENT:     Head: Normocephalic and atraumatic.     Nose: Nose normal.  Eyes:     Conjunctiva/sclera: Conjunctivae normal.  Cardiovascular:     Rate and Rhythm: Normal  rate.  Pulmonary:     Effort: Pulmonary effort is normal. No respiratory distress.  Abdominal:     General: There is no distension.  Musculoskeletal:        General: Normal range of motion.     Cervical back: Neck supple.     Comments: Back: Nontender palpation of the cervical, thoracic spine midline, diffusely tender throughout lumbar spine midline and left lumbar musculature extending into upper gluteal area  Strength 5/5 ankle bilaterally at hips  Skin:    General: Skin is warm and dry.  Neurological:     Mental Status: She is alert and oriented to person, place, and time.      UC Treatments / Results  Labs (all labs ordered are listed, but only abnormal results are displayed) Labs Reviewed - No data to display  EKG   Radiology No results found.  Procedures Procedures (including critical care time)  Medications Ordered in UC Medications  ketorolac (TORADOL) injection 60 mg (has no administration in time range)    Initial Impression / Assessment and Plan / UC Course  I have reviewed the triage vital signs and the nursing notes.  Pertinent labs & imaging results that were available during my care of the patient were reviewed by me and considered in my medical decision making (see chart for details).     Prior imaging negative, no red flags for cauda equina.  Suspect likely contusion/muscle straining with some radicular distribution of pain.  No relief with NSAIDs, will try prednisone as alternative.  Toradol prior to discharge, Flexeril.  Has monthly prescriptions of hydrocodone, may use this as prescribed.  Discussed strict return precautions. Patient verbalized understanding and is agreeable with plan.  Final Clinical Impressions(s) / UC Diagnoses   Final diagnoses:  Acute left-sided low back pain with left-sided sciatica     Discharge Instructions     We gave you an injection of Toradol, this hopefully will ease off pain in approximately 30-40  minutes Begin prednisone taper over the next 6 days-begin with 6 tablets today with food, decrease by 1 tablet each day until complete-6, 5, 4, 3, 2, 1 You may use flexeril  as needed to help with pain. This is a muscle relaxer and causes sedation- please use only at bedtime or when you will be home and not have to drive/work May use your hydrocodone prescribed to you as needed, I cannot provide further narcotics given you have a 30-day supply is from 6/14 Gentle stretching, avoid complete bedrest  Please follow-up if any symptoms not improving or worsening   ED Prescriptions    Medication Sig Dispense Auth. Provider   predniSONE (DELTASONE) 10 MG tablet Begin with 6 tabs on day 1, 5 tab on day 2, 4 tab on day 3, 3 tab on day 4, 2 tab on day 5, 1 tab on day 6-take with food 21 tablet Yvonne Petite C, PA-C   cyclobenzaprine (FLEXERIL) 10 MG tablet Take 1 tablet (10 mg total) by mouth 2 (two) times daily as needed for muscle spasms. 20 tablet Benney [place], [place] C, PA-C     I have reviewed the PDMP during this encounter.   Lew Dawes, [place] 04/08/20 [phone]

## 2020-04-08 NOTE — ED Triage Notes (Signed)
Pt presents today after "large box fell on her Saturday at work". Pt seen at [place] [facility], negative X-ray. Pt told to return if pain persists. Pt states back pain is still present and impacting ADL's. Pt states she has been taking tylenol and ibuprofen with out relief. Pt last dose ibuprofen yesterday 2000. Pt describes pain as a burning sensation on lower left back.

## 2020-07-14 ENCOUNTER — Ambulatory Visit (INDEPENDENT_AMBULATORY_CARE_PROVIDER_SITE_OTHER): Payer: Medicaid Other | Admitting: Podiatry

## 2020-07-14 ENCOUNTER — Other Ambulatory Visit: Payer: Self-pay

## 2020-07-14 ENCOUNTER — Encounter: Payer: Self-pay | Admitting: Podiatry

## 2020-07-14 ENCOUNTER — Ambulatory Visit (INDEPENDENT_AMBULATORY_CARE_PROVIDER_SITE_OTHER): Payer: Medicaid Other

## 2020-07-14 DIAGNOSIS — M7661 Achilles tendinitis, right leg: Secondary | ICD-10-CM | POA: Diagnosis not present

## 2020-07-14 DIAGNOSIS — M722 Plantar fascial fibromatosis: Secondary | ICD-10-CM

## 2020-07-14 DIAGNOSIS — M7662 Achilles tendinitis, left leg: Secondary | ICD-10-CM

## 2020-07-14 MED ORDER — METHYLPREDNISOLONE 4 MG PO TBPK: ORAL_TABLET | ORAL | 0 refills | Status: DC

## 2020-07-14 MED ORDER — MELOXICAM 15 MG PO TABS: 15.0000 mg | ORAL_TABLET | Freq: Every day | ORAL | 1 refills | Status: DC

## 2020-07-14 NOTE — Progress Notes (Signed)
   Subjective: 39 y.o. female presenting today as a new patient for evaluation of bilateral foot and ankle pain.  She has been dealing with this pain approximately 4-5 months now.  She has not done anything for treatment.  She states that it has been aggravated and painful with work.  She currently works at [employer] and is on her feet during her entire shift.  She presents for further treatment evaluation   Past Medical History:  Diagnosis Date  . Ganglion of right wrist   . Right wrist pain   . Wears glasses      Objective: Physical Exam General: The patient is alert and oriented x3 in no acute distress.  Dermatology: Skin is warm, dry and supple bilateral lower extremities. Negative for open lesions or macerations bilateral.   Vascular: Dorsalis Pedis and Posterior Tibial pulses palpable bilateral.  Capillary fill time is immediate to all digits.  Neurological: Epicritic and protective threshold intact bilateral.   Musculoskeletal: Tenderness to palpation to the plantar aspect of the bilateral heels along the plantar fascia. All other joints range of motion within normal limits bilateral. Strength 5/5 in all groups bilateral.  Pain on palpation also noted to the bilateral Achilles tendons at the insertion to the posterior tubercle of the calcaneus  Radiographic exam: Normal osseous mineralization. Joint spaces preserved. No fracture/dislocation/boney destruction. No other soft tissue abnormalities or radiopaque foreign bodies.   Assessment: 1. plantar fasciitis bilateral feet 2.  Achilles tendinitis bilateral  Plan of Care:  1. Patient evaluated. Xrays reviewed.   2. Injection of 0.5cc Celestone soluspan injected into the bilateral heels.  3. Rx for Medrol Dose Pak placed 4. Rx for Meloxicam ordered for patient. 5.  Ankle braces dispensed bilateral. 6. Instructed patient regarding therapies and modalities at home to alleviate symptoms.  7.  Recommend new balance nonslip  shoes  8.  Return to clinic in 4 weeks.    *Works at [employer], DPM [facility]  Dr. Felecia Shelling, DPM    [address] [place], [place] [address]                Office [phone]  Fax [phone]

## 2020-08-11 ENCOUNTER — Other Ambulatory Visit: Payer: Self-pay

## 2020-08-11 ENCOUNTER — Ambulatory Visit (INDEPENDENT_AMBULATORY_CARE_PROVIDER_SITE_OTHER): Payer: Medicaid Other | Admitting: Podiatry

## 2020-08-11 DIAGNOSIS — M7661 Achilles tendinitis, right leg: Secondary | ICD-10-CM

## 2020-08-11 DIAGNOSIS — M722 Plantar fascial fibromatosis: Secondary | ICD-10-CM

## 2020-08-11 DIAGNOSIS — M7662 Achilles tendinitis, left leg: Secondary | ICD-10-CM

## 2020-08-11 NOTE — Progress Notes (Signed)
   Subjective: 39 y.o. female presenting today for follow-up evaluation of plantar fasciitis to the bilateral feet as well as Achilles tendinitis.  Patient states that overall she is feeling better.  She took the Medrol Dosepak as prescribed.  She is still taking the meloxicam daily.  No new complaints at this time   Past Medical History:  Diagnosis Date  . Ganglion of right wrist   . Right wrist pain   . Wears glasses      Objective: Physical Exam General: The patient is alert and oriented x3 in no acute distress.  Dermatology: Skin is warm, dry and supple bilateral lower extremities. Negative for open lesions or macerations bilateral.   Vascular: Dorsalis Pedis and Posterior Tibial pulses palpable bilateral.  Capillary fill time is immediate to all digits.  Neurological: Epicritic and protective threshold intact bilateral.   Musculoskeletal: Tenderness to palpation to the plantar aspect of the bilateral heels along the plantar fascia. All other joints range of motion within normal limits bilateral. Strength 5/5 in all groups bilateral.  Pain on palpation also noted to the bilateral Achilles tendons at the insertion to the posterior tubercle of the calcaneus  Radiographic exam: Normal osseous mineralization. Joint spaces preserved. No fracture/dislocation/boney destruction. No other soft tissue abnormalities or radiopaque foreign bodies.   Assessment: 1. plantar fasciitis bilateral feet 2.  Achilles tendinitis bilateral  Plan of Care:  1. Patient evaluated. .   2. I overall the patient is improving.  Continue conservative treatment at the moment  3.  Patient declined steroidal injections today  4.  Continue meloxicam daily as needed 5.  Discontinue ankle braces as they may be irritating the foot.  Compression ankle sleeves dispensed.  Wear daily. 6. Instructed patient regarding therapies and modalities at home to alleviate symptoms.  7.  Recommend new balance nonslip shoes    8.  Return to clinic as needed  *Works at [employer], DPM [facility]  Dr. Felecia Shelling, DPM    [address] [place], [place] [address]                Office [phone]  Fax [phone]

## 2020-09-22 ENCOUNTER — Encounter (HOSPITAL_COMMUNITY): Payer: Self-pay | Admitting: *Deleted

## 2020-09-22 ENCOUNTER — Ambulatory Visit (HOSPITAL_COMMUNITY)
Admission: EM | Admit: 2020-09-22 | Discharge: 2020-09-22 | Disposition: A | Discharge status: Home / Self Care | Payer: Medicaid Other | Attending: Family Medicine | Admitting: Family Medicine

## 2020-09-22 ENCOUNTER — Other Ambulatory Visit: Payer: Self-pay

## 2020-09-22 DIAGNOSIS — Z202 Contact with and (suspected) exposure to infections with a predominantly sexual mode of transmission: Secondary | ICD-10-CM | POA: Diagnosis present

## 2020-09-22 DIAGNOSIS — Z3202 Encounter for pregnancy test, result negative: Secondary | ICD-10-CM | POA: Diagnosis not present

## 2020-09-22 DIAGNOSIS — Z113 Encounter for screening for infections with a predominantly sexual mode of transmission: Secondary | ICD-10-CM | POA: Insufficient documentation

## 2020-09-22 LAB — POC URINE PREG, ED: Preg Test, Ur: NEGATIVE

## 2020-09-22 MED ORDER — LIDOCAINE HCL (PF) 1 % IJ SOLN
INTRAMUSCULAR | Status: AC
  Filled 2020-09-22: qty 2

## 2020-09-22 MED ORDER — CEFTRIAXONE SODIUM 500 MG IJ SOLR
500.0000 mg | Freq: Once | INTRAMUSCULAR | Status: AC
  Administered 2020-09-22: 500 mg via INTRAMUSCULAR

## 2020-09-22 MED ORDER — DOXYCYCLINE HYCLATE 100 MG PO CAPS: 100.0000 mg | ORAL_CAPSULE | Freq: Two times a day (BID) | ORAL | 0 refills | Status: AC

## 2020-09-22 MED ORDER — CEFTRIAXONE SODIUM 500 MG IJ SOLR
INTRAMUSCULAR | Status: AC
  Filled 2020-09-22: qty 500

## 2020-09-22 NOTE — ED Provider Notes (Signed)
[facility]    CSN: [id number] Arrival date & time: 09/22/20  4166      History   Chief Complaint Chief Complaint  Patient presents with  . Exposure to STD    HPI Alice Wagner is a 39 y.o. female.   Alice Wagner presents with complaints of concerns for std. Her partner was seen and treated for std today. She states she has noted occasional odor and discomfort to vagina without significant discharge. No vaginal bleeding. Hasn't missed a period. No pelvic pain. No urinary symptoms. Has had std in the past.    ROS per HPI, negative if not otherwise mentioned.      Past Medical History:  Diagnosis Date  . Ganglion of right wrist   . Right wrist pain   . Wears glasses     Patient Active Problem List   Diagnosis Date Noted  . Aftercare 06/08/2018  . 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 CESAREAN SECTION  2012;  10-08-2009 ;  01-07-2005  . WRIST ARTHROSCOPY Right 05/29/2015   Procedure: RIGHT WRIST ARTHROSCOPY DEBRIDEMENT, ARTHROSCOPIC GANGLION CYST DECOMPRESSION;  Surgeon: Bradly Bienenstock, MD;  Location: [facility];  Service: Orthopedics;  Laterality: Right;    OB History    Gravida  4   Para  2   Term  2   Preterm      AB  2   Living  2     SAB  2   TAB      Ectopic      Multiple      Live Births  2            Home Medications    Prior to Admission medications   Medication Sig Start Date End Date Taking? Authorizing Provider  cyclobenzaprine (FLEXERIL) 10 MG tablet Take 1 tablet (10 mg total) by mouth 2 (two) times daily as needed for muscle spasms. 04/08/20  Yes Wieters, Hallie C, PA-C  ergocalciferol (VITAMIN D2) 1.25 MG (50000 UT) capsule ergocalciferol (vitamin D2) 1,250 mcg (50,000 unit) capsule  TAKE 1 CAPSULE BY MOUTH ONCE WEEKLY FOR 8 WEEKS FOR 30 DAYS   Yes [provider]  fluticasone (FLONASE) 50 MCG/ACT nasal spray Place into  both nostrils. 05/21/20  Yes [provider]  ibuprofen (ADVIL) 600 MG tablet TAKE 1 TABLET BY MOUTH THREE TIMES DAILY WITH FOOD AS NEEDED 04/23/20  Yes [provider]  loratadine (CLARITIN) 10 MG tablet Take 10 mg by mouth daily. 05/21/20  Yes [provider]  meloxicam (MOBIC) 15 MG tablet Take 1 tablet (15 mg total) by mouth daily. 07/14/20  Yes Felecia Shelling, DPM  doxycycline (VIBRAMYCIN) 100 MG capsule Take 1 capsule (100 mg total) by mouth 2 (two) times daily for 7 days. 09/22/20 09/29/20  Georgetta Haber, NP  escitalopram (LEXAPRO) 10 MG tablet Take 10 mg by mouth daily. 05/21/20   [provider]  fluconazole (DIFLUCAN) 150 MG tablet Take 150 mg by mouth once. 05/30/20   [provider]  HYDROcodone-acetaminophen (NORCO/VICODIN) 5-325 MG tablet Take 1 tablet by mouth every 8 (eight) hours. 06/27/20   [provider]  methylPREDNISolone (MEDROL DOSEPAK) 4 MG TBPK tablet 6 day dose pack - take as directed 07/14/20   Felecia Shelling, DPM  predniSONE (DELTASONE) 10 MG tablet Begin with 6 tabs on day 1, 5 tab on day 2, 4 tab on day 3, 3 tab on  day 4, 2 tab on day 5, 1 tab on day 6-take with food 04/08/20   Wieters, Junius Creamer, PA-C    Family History History reviewed. No pertinent family history.  Social History Social History   Tobacco Use  . Smoking status: Former Smoker    Years: 6.00    Types: Cigarettes    Quit date: 05/22/2013    Years since quitting: 7.3  . Smokeless tobacco: Never Used  Vaping Use  . Vaping Use: Never used  Substance Use Topics  . Alcohol use: No  . Drug use: No     Allergies   Aloe, Aspirin, and No known allergies   Review of Systems Review of Systems   Physical Exam Triage Vital Signs ED Triage Vitals  Enc Vitals Group     BP --      Pulse Rate 09/22/20 0954 79     Resp 09/22/20 0954 18     Temp 09/22/20 0954 98.7 F (37.1 C)     Temp Source 09/22/20 0954 Oral     SpO2 09/22/20 0954 100 %     Weight  09/22/20 0956 185 lb (83.9 kg)     Height 09/22/20 0956 5\' 5"  (1.651 m)     Head Circumference --      Peak Flow --      Pain Score 09/22/20 0956 0     Pain Loc --      Pain Edu? --      Excl. in [place]? --    No data found.  Updated Vital Signs Pulse 79   Temp 98.7 F (37.1 C) (Oral)   Resp 18   Ht 5\' 5"  (1.651 m)   Wt 185 lb (83.9 kg)   LMP 09/03/2020   SpO2 100%   BMI 30.79 kg/m   Visual Acuity Right Eye Distance:   Left Eye Distance:   Bilateral Distance:    Right Eye Near:   Left Eye Near:    Bilateral Near:     Physical Exam Constitutional:      General: She is not in acute distress.    Appearance: She is well-developed.  Cardiovascular:     Rate and Rhythm: Normal rate.  Pulmonary:     Effort: Pulmonary effort is normal.  Abdominal:     Palpations: Abdomen is not rigid.     Tenderness: There is no abdominal tenderness. There is no guarding or rebound.  Genitourinary:    Comments: Denies sores, lesions, vaginal bleeding; no pelvic pain; gu exam deferred at this time, vaginal self swab collected.   Skin:    General: Skin is warm and dry.  Neurological:     Mental Status: She is alert and oriented to person, place, and time.      UC Treatments / Results  Labs (all labs ordered are listed, but only abnormal results are displayed) Labs Reviewed  POC URINE PREG, ED  CERVICOVAGINAL ANCILLARY ONLY    EKG   Radiology No results found.  Procedures Procedures (including critical care time)  Medications Ordered in UC Medications  cefTRIAXone (ROCEPHIN) injection 500 mg (has no administration in time range)    Initial Impression / Assessment and Plan / UC Course  I have reviewed the triage vital signs and the nursing notes.  Pertinent labs & imaging results that were available during my care of the patient were reviewed by me and considered in my medical decision making (see chart for details).     Partner is getting  actively treated. Some odor  and discomfort to vagina, empiric sti treatment initiated with cytology pending. Safe sex encouraged. Patient verbalized understanding and agreeable to plan.    Final Clinical Impressions(s) / UC Diagnoses   Final diagnoses:  Screen for STD (sexually transmitted disease)  Possible exposure to STD     Discharge Instructions     We are testing and treating you for gonorrhea, chlamydia and trichomonas.  Please withhold from intercourse for the next week. Please use condoms to prevent STD's.   Results will post on your MyChart. We will call if any are positive.    ED Prescriptions    Medication Sig Dispense Auth. Provider   doxycycline (VIBRAMYCIN) 100 MG capsule Take 1 capsule (100 mg total) by mouth 2 (two) times daily for 7 days. 14 capsule Georgetta Haber, NP     PDMP not reviewed this encounter.   Georgetta Haber, NP 09/22/20 1034

## 2020-09-22 NOTE — ED Triage Notes (Signed)
Pt reports partner is being treated for STD

## 2020-09-22 NOTE — Discharge Instructions (Signed)
We are testing and treating you for gonorrhea, chlamydia and trichomonas.  Please withhold from intercourse for the next week. Please use condoms to prevent STD's.   Results will post on your MyChart. We will call if any are positive.

## 2020-09-23 ENCOUNTER — Telehealth (HOSPITAL_COMMUNITY): Payer: Self-pay | Admitting: Emergency Medicine

## 2020-09-23 LAB — CERVICOVAGINAL ANCILLARY ONLY
Bacterial Vaginitis (gardnerella): POSITIVE — AB
Candida Glabrata: NEGATIVE
Candida Vaginitis: NEGATIVE
Chlamydia: NEGATIVE
Comment: NEGATIVE
Comment: NEGATIVE
Comment: NEGATIVE
Comment: NEGATIVE
Comment: NEGATIVE
Comment: NORMAL
Neisseria Gonorrhea: NEGATIVE
Trichomonas: POSITIVE — AB

## 2020-09-23 MED ORDER — METRONIDAZOLE 500 MG PO TABS: 500.0000 mg | ORAL_TABLET | Freq: Two times a day (BID) | ORAL | 0 refills | Status: DC

## 2020-10-25 ENCOUNTER — Ambulatory Visit
Admission: EM | Admit: 2020-10-25 | Discharge: 2020-10-25 | Disposition: A | Discharge status: Home / Self Care | Payer: Medicaid Other | Attending: Emergency Medicine | Admitting: Emergency Medicine

## 2020-10-25 ENCOUNTER — Other Ambulatory Visit: Payer: Self-pay

## 2020-10-25 ENCOUNTER — Encounter: Payer: Self-pay | Admitting: Emergency Medicine

## 2020-10-25 ENCOUNTER — Emergency Department (HOSPITAL_COMMUNITY)
Admission: EM | Admit: 2020-10-25 | Discharge: 2020-10-25 | Disposition: A | Discharge status: Home / Self Care | Payer: Medicaid Other | Attending: Emergency Medicine | Admitting: Emergency Medicine

## 2020-10-25 DIAGNOSIS — M546 Pain in thoracic spine: Secondary | ICD-10-CM

## 2020-10-25 DIAGNOSIS — Z5321 Procedure and treatment not carried out due to patient leaving prior to being seen by health care provider: Secondary | ICD-10-CM | POA: Diagnosis not present

## 2020-10-25 DIAGNOSIS — M545 Low back pain, unspecified: Secondary | ICD-10-CM | POA: Diagnosis not present

## 2020-10-25 MED ORDER — IBUPROFEN 800 MG PO TABS: 800.0000 mg | ORAL_TABLET | Freq: Three times a day (TID) | ORAL | 0 refills | Status: DC

## 2020-10-25 MED ORDER — TIZANIDINE HCL 4 MG PO TABS: 4.0000 mg | ORAL_TABLET | Freq: Four times a day (QID) | ORAL | 0 refills | Status: DC | PRN

## 2020-10-25 MED ORDER — KETOROLAC TROMETHAMINE 30 MG/ML IJ SOLN
30.0000 mg | Freq: Once | INTRAMUSCULAR | Status: AC
  Administered 2020-10-25: 30 mg via INTRAMUSCULAR

## 2020-10-25 NOTE — ED Provider Notes (Signed)
[facility]    CSN: [id number] Arrival date & time: 10/25/20  3419      History   Chief Complaint Chief Complaint  Patient presents with  . Back Pain    HPI Alice Wagner is a 40 y.o. female presenting today for evaluation of back pain.  Reports that she has had upper back pain with spasms for the past 2 days.  Denies injury.  Reports remote injury approximately 6 months ago where she had negative x-ray.  Denies any new pains injury, but symptoms are flaring up previously.  Reports spasming into back.  Pain worsens with any type of straining.  HPI  Past Medical History:  Diagnosis Date  . Ganglion of right wrist   . Right wrist pain   . Wears glasses     Patient Active Problem List   Diagnosis Date Noted  . Aftercare 06/08/2018  . 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 CESAREAN SECTION  2012;  10-08-2009 ;  01-07-2005  . WRIST ARTHROSCOPY Right 05/29/2015   Procedure: RIGHT WRIST ARTHROSCOPY DEBRIDEMENT, ARTHROSCOPIC GANGLION CYST DECOMPRESSION;  Surgeon: Bradly Bienenstock, MD;  Location: [facility];  Service: Orthopedics;  Laterality: Right;    OB History    Gravida  4   Para  2   Term  2   Preterm      AB  2   Living  2     SAB  2   IAB      Ectopic      Multiple      Live Births  2            Home Medications    Prior to Admission medications   Medication Sig Start Date End Date Taking? Authorizing Provider  ibuprofen (ADVIL) 800 MG tablet Take 1 tablet (800 mg total) by mouth 3 (three) times daily. 10/25/20  Yes Malone Admire C, PA-C  tiZANidine (ZANAFLEX) 4 MG tablet Take 1 tablet (4 mg total) by mouth every 6 (six) hours as needed for muscle spasms. 10/25/20  Yes Lovinia Snare C, PA-C  ergocalciferol (VITAMIN D2) 1.25 MG (50000 UT) capsule ergocalciferol (vitamin D2) 1,250 mcg (50,000 unit) capsule  TAKE 1 CAPSULE BY MOUTH ONCE WEEKLY FOR 8 WEEKS  FOR 30 DAYS    [provider]  fluticasone (FLONASE) 50 MCG/ACT nasal spray Place into both nostrils. 05/21/20   [provider]  loratadine (CLARITIN) 10 MG tablet Take 10 mg by mouth daily. 05/21/20   [provider]  meloxicam (MOBIC) 15 MG tablet Take 1 tablet (15 mg total) by mouth daily. 07/14/20   Felecia Shelling, DPM  metroNIDAZOLE (FLAGYL) 500 MG tablet Take 1 tablet (500 mg total) by mouth 2 (two) times daily. 09/23/20   Merrilee Jansky, MD  escitalopram (LEXAPRO) 10 MG tablet Take 10 mg by mouth daily. 05/21/20 10/25/20  [provider]    Family History History reviewed. No pertinent family history.  Social History Social History   Tobacco Use  . Smoking status: Former Smoker    Years: 6.00    Types: Cigarettes    Quit date: 05/22/2013    Years since quitting: 7.4  . Smokeless tobacco: Never Used  Vaping Use  . Vaping Use: Never used  Substance Use Topics  . Alcohol use: No  . Drug use: No     Allergies   Aloe, Aspirin, and No known allergies  Review of Systems Review of Systems  Constitutional: Negative for fatigue and fever.  Eyes: Negative for visual disturbance.  Respiratory: Negative for shortness of breath.   Cardiovascular: Negative for chest pain.  Gastrointestinal: Negative for abdominal pain, nausea and vomiting.  Musculoskeletal: Positive for back pain and myalgias. Negative for arthralgias and joint swelling.  Skin: Negative for color change, rash and wound.  Neurological: Negative for dizziness, weakness, light-headedness and headaches.     Physical Exam Triage Vital Signs ED Triage Vitals [10/25/20 1029]  Enc Vitals Group     BP (!) 147/80     Pulse Rate 76     Resp 18     Temp 98.3 F (36.8 C)     Temp Source Oral     SpO2 96 %     Weight      Height      Head Circumference      Peak Flow      Pain Score 10     Pain Loc      Pain Edu?      Excl. in GC?    No data found.  Updated Vital Signs BP  (!) 147/80 (BP Location: Left Arm)   Pulse 76   Temp 98.3 F (36.8 C) (Oral)   Resp 18   SpO2 96%   Visual Acuity Right Eye Distance:   Left Eye Distance:   Bilateral Distance:    Right Eye Near:   Left Eye Near:    Bilateral Near:     Physical Exam Vitals and nursing note reviewed.  Constitutional:      Appearance: She is well-developed and well-nourished.     Comments: Appears uncomfortable, leaning hunched over  HENT:     Head: Normocephalic and atraumatic.     Nose: Nose normal.  Eyes:     Conjunctiva/sclera: Conjunctivae normal.  Cardiovascular:     Rate and Rhythm: Normal rate.  Pulmonary:     Effort: Pulmonary effort is normal. No respiratory distress.  Abdominal:     General: There is no distension.  Musculoskeletal:        General: Normal range of motion.     Cervical back: Neck supple.     Comments: Nontender to palpation along cervical and thoracic spine, increased diffuse tenderness throughout left thoracic musculature extending to periscapular area  Full active range of motion of upper extremities  Skin:    General: Skin is warm and dry.  Neurological:     Mental Status: She is alert and oriented to person, place, and time.  Psychiatric:        Mood and Affect: Mood and affect normal.      UC Treatments / Results  Labs (all labs ordered are listed, but only abnormal results are displayed) Labs Reviewed - No data to display  EKG   Radiology No results found.  Procedures Procedures (including critical care time)  Medications Ordered in UC Medications  ketorolac (TORADOL) 30 MG/ML injection 30 mg (30 mg Intramuscular Given 10/25/20 1130)    Initial Impression / Assessment and Plan / UC Course  I have reviewed the triage vital signs and the nursing notes.  Pertinent labs & imaging results that were available during my care of the patient were reviewed by me and considered in my medical decision making (see chart for details).      Treating for thoracic strain/spasming with Toradol prior to discharge, continuing on NSAIDs and muscle relaxers.  Discussed activity modification.  Discussed strict  return precautions. Patient verbalized understanding and is agreeable with plan.  Final Clinical Impressions(s) / UC Diagnoses   Final diagnoses:  Acute left-sided thoracic back pain     Discharge Instructions     We gave you a shot of Toradol Ibuprofen and Tylenol around-the-clock Supplement with tizanidine which is a muscle relaxant Alternate ice and heat Follow-up if not improving or worsening    ED Prescriptions    Medication Sig Dispense Auth. Provider   ibuprofen (ADVIL) 800 MG tablet Take 1 tablet (800 mg total) by mouth 3 (three) times daily. 21 tablet Altamese Deguire C, PA-C   tiZANidine (ZANAFLEX) 4 MG tablet Take 1 tablet (4 mg total) by mouth every 6 (six) hours as needed for muscle spasms. 30 tablet Juliano Mceachin, Paden C, PA-C     PDMP not reviewed this encounter.   Lew Dawes, PA-C 10/25/20 1214

## 2020-10-25 NOTE — ED Triage Notes (Signed)
Pt sts mid to upper back pain with spasms x 2 days; denies obvious injury

## 2020-10-25 NOTE — ED Notes (Signed)
Pt called [facility] for vitals recheck. Pt cannot be located.

## 2020-10-25 NOTE — Discharge Instructions (Signed)
We gave you a shot of Toradol Ibuprofen and Tylenol around-the-clock Supplement with tizanidine which is a muscle relaxant Alternate ice and heat Follow-up if not improving or worsening

## 2020-10-25 NOTE — ED Notes (Signed)
Called patient for V/S check, unable to locate patient at this time.

## 2020-10-25 NOTE — ED Triage Notes (Signed)
Pt here via [facility] for eval of mid-lower back pain that began yesterday but began getting worse at approx 0500. Pt ambulatory with assistance due to pain, NAD in triage, VSS

## 2021-09-15 ENCOUNTER — Ambulatory Visit (INDEPENDENT_AMBULATORY_CARE_PROVIDER_SITE_OTHER): Payer: Medicaid Other

## 2021-09-15 ENCOUNTER — Encounter (HOSPITAL_COMMUNITY): Payer: Self-pay

## 2021-09-15 ENCOUNTER — Ambulatory Visit (HOSPITAL_COMMUNITY)
Admission: EM | Admit: 2021-09-15 | Discharge: 2021-09-15 | Disposition: A | Discharge status: Home / Self Care | Payer: Medicaid Other | Attending: Emergency Medicine | Admitting: Emergency Medicine

## 2021-09-15 ENCOUNTER — Other Ambulatory Visit: Payer: Self-pay

## 2021-09-15 DIAGNOSIS — R0602 Shortness of breath: Secondary | ICD-10-CM | POA: Diagnosis not present

## 2021-09-15 DIAGNOSIS — R079 Chest pain, unspecified: Secondary | ICD-10-CM

## 2021-09-15 DIAGNOSIS — R200 Anesthesia of skin: Secondary | ICD-10-CM

## 2021-09-15 DIAGNOSIS — S46911A Strain of unspecified muscle, fascia and tendon at shoulder and upper arm level, right arm, initial encounter: Secondary | ICD-10-CM

## 2021-09-15 MED ORDER — DEXAMETHASONE SODIUM PHOSPHATE 10 MG/ML IJ SOLN
10.0000 mg | Freq: Once | INTRAMUSCULAR | Status: AC
  Administered 2021-09-15: 10 mg via INTRAMUSCULAR

## 2021-09-15 MED ORDER — ALBUTEROL SULFATE HFA 108 (90 BASE) MCG/ACT IN AERS
2.0000 | INHALATION_SPRAY | Freq: Once | RESPIRATORY_TRACT | Status: AC
  Administered 2021-09-15: 2 via RESPIRATORY_TRACT

## 2021-09-15 MED ORDER — CYCLOBENZAPRINE HCL 10 MG PO TABS: 10.0000 mg | ORAL_TABLET | Freq: Three times a day (TID) | ORAL | 0 refills | Status: DC | PRN

## 2021-09-15 NOTE — ED Provider Notes (Signed)
[facility]    CSN: [id number] Arrival date & time: 09/15/21  0808      History   Chief Complaint Chief Complaint  Patient presents with   Shortness of Breath   Chest Pain    HPI Alice Wagner is a 40 y.o. female.   Presenting today with over a month of almost daily persistent chest pain, intermittent episodes of right neck, arm stiffness, soreness, radiation of tingling pain, shortness of breath.  She states her chest pain significantly worsens with deep breaths, coughing.  She denies known wheezing, palpitations, dizziness, abdominal pain, nausea vomiting or diarrhea.  She has so far not tried anything other than deep breathing when the episodes are severe.  She has not noticed anything that seems to trigger it situationally.  Does have a history of asthma, not currently on any inhaler regimen.  Has not had any flareups of this for many years per patient.  No known history of heart issues but does states she has a family history of heart issues.  Requesting a work note for her symptoms additionally.   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    Past Surgical History:  Procedure Laterality Date   CESAREAN SECTION  2012;  10-08-2009 ;  01-07-2005   WRIST ARTHROSCOPY Right 05/29/2015   Procedure: RIGHT WRIST ARTHROSCOPY DEBRIDEMENT, ARTHROSCOPIC GANGLION CYST DECOMPRESSION;  Surgeon: Bradly Bienenstock, MD;  Location: [facility];  Service: Orthopedics;  Laterality: Right;    OB History     Gravida  4   Para  2   Term  2   Preterm      AB  2   Living  2      SAB  2   IAB      Ectopic      Multiple      Live Births  2            Home Medications    Prior to Admission medications   Medication Sig Start Date End Date Taking? Authorizing  Provider  cyclobenzaprine (FLEXERIL) 10 MG tablet Take 1 tablet (10 mg total) by mouth 3 (three) times daily as needed for muscle spasms. Do not drink alcohol or drive while taking this medication.  May cause drowsiness. 09/15/21  Yes Particia Nearing, PA-C  ergocalciferol (VITAMIN D2) 1.25 MG (50000 UT) capsule ergocalciferol (vitamin D2) 1,250 mcg (50,000 unit) capsule  TAKE 1 CAPSULE BY MOUTH ONCE WEEKLY FOR 8 WEEKS FOR 30 DAYS    [provider]  fluticasone (FLONASE) 50 MCG/ACT nasal spray Place into both nostrils. 05/21/20   [provider]  ibuprofen (ADVIL) 800 MG tablet Take 1 tablet (800 mg total) by mouth 3 (three) times daily. 10/25/20   Wieters, Hallie C, PA-C  loratadine (CLARITIN) 10 MG tablet Take 10 mg by mouth daily. 05/21/20   [provider]  meloxicam (MOBIC) 15 MG tablet Take 1 tablet (15 mg total) by mouth daily. 07/14/20   Felecia Shelling, DPM  metroNIDAZOLE (FLAGYL) 500 MG tablet Take 1 tablet (500 mg total) by mouth 2 (two) times daily. 09/23/20   Lamptey, Britta Mccreedy, MD  tiZANidine (ZANAFLEX) 4 MG tablet Take 1 tablet (4 mg total) by mouth every 6 (six) hours as  needed for muscle spasms. 10/25/20   Wieters, Hallie C, PA-C  escitalopram (LEXAPRO) 10 MG tablet Take 10 mg by mouth daily. 05/21/20 10/25/20  [provider]    Family History History reviewed. No pertinent family history.  Social History Social History   Tobacco Use   Smoking status: Former    Years: 6.00    Types: Cigarettes    Quit date: 05/22/2013    Years since quitting: 8.3   Smokeless tobacco: Never  Vaping Use   Vaping Use: Never used  Substance Use Topics   Alcohol use: No   Drug use: No     Allergies   Aloe, Aspirin, and No known allergies   Review of Systems Review of Systems Per HPI  Physical Exam Triage Vital Signs ED Triage Vitals  Enc Vitals Group     BP 09/15/21 0833 (!) 134/92     Pulse Rate 09/15/21 0833 (!) 109     Resp 09/15/21 0833 20      Temp 09/15/21 0833 98.5 F (36.9 C)     Temp Source 09/15/21 0833 Oral     SpO2 09/15/21 0833 96 %     Weight --      Height --      Head Circumference --      Peak Flow --      Pain Score 09/15/21 0836 10     Pain Loc --      Pain Edu? --      Excl. in GC? --    No data found.  Updated Vital Signs BP (!) 134/92 (BP Location: Right Arm)   Pulse 93   Temp 98.5 F (36.9 C) (Oral)   Resp 20   LMP  (Within Weeks) Comment: 1 week  SpO2 96%   Breastfeeding No   Visual Acuity Right Eye Distance:   Left Eye Distance:   Bilateral Distance:    Right Eye Near:   Left Eye Near:    Bilateral Near:     Physical Exam Vitals and nursing note reviewed.  Constitutional:      General: She is not in acute distress.    Appearance: She is not ill-appearing.  HENT:     Head: Atraumatic.     Nose: Nose normal.     Mouth/Throat:     Mouth: Mucous membranes are moist.     Pharynx: Oropharynx is clear.  Eyes:     Extraocular Movements: Extraocular movements intact.     Conjunctiva/sclera: Conjunctivae normal.  Cardiovascular:     Rate and Rhythm: Normal rate and regular rhythm.     Heart sounds: Normal heart sounds.  Pulmonary:     Effort: Pulmonary effort is normal.     Breath sounds: Normal breath sounds. No wheezing or rales.  Abdominal:     General: Bowel sounds are normal. There is no distension.     Palpations: Abdomen is soft.     Tenderness: There is no abdominal tenderness. There is no guarding.  Musculoskeletal:        General: Tenderness present. Normal range of motion.     Cervical back: Normal range of motion and neck supple.     Comments: Significant tenderness to palpation and guarding upon palpation of right SCM, trapezius and deltoid as well as with range of motion exam right arm  Negative Homans' sign bilaterally  Skin:    General: Skin is warm and dry.     Findings: No erythema or rash.  Neurological:  Mental Status: She is alert and oriented to person,  place, and time.  Psychiatric:        Thought Content: Thought content normal.        Judgment: Judgment normal.     Comments: Tearful, anxious     UC Treatments / Results  Labs (all labs ordered are listed, but only abnormal results are displayed) Labs Reviewed - No data to display  EKG   Radiology DG Chest 2 View  Result Date: 09/15/2021 CLINICAL DATA:  Chest pain, shortness of breath. EXAM: CHEST - 2 VIEW COMPARISON:  May 19, 2019. FINDINGS: The heart size and mediastinal contours are within normal limits. Both lungs are clear. The visualized skeletal structures are unremarkable. IMPRESSION: No active cardiopulmonary disease. Electronically Signed   By: Lupita Raider M.D.   On: 09/15/2021 09:30    Procedures Procedures (including critical care time)  Medications Ordered in UC Medications  dexamethasone (DECADRON) injection 10 mg (10 mg Intramuscular Given 09/15/21 0935)  albuterol (VENTOLIN HFA) 108 (90 Base) MCG/ACT inhaler 2 puff (2 puffs Inhalation Given 09/15/21 0934)    Initial Impression / Assessment and Plan / UC Course  I have reviewed the triage vital signs and the nursing notes.  Pertinent labs & imaging results that were available during my care of the patient were reviewed by me and considered in my medical decision making (see chart for details).     Minimally hypertensive in triage, otherwise vital signs overall within normal limits and reassuring.  She is very anxious and tearful during interview and exam, but overall findings are very reassuring with no red flag findings.  EKG today showing normal sinus rhythm at 89 bpm without acute ST or T wave changes.  Chest x-ray was performed for further rule out which showed no acute cardiopulmonary disease.  Albuterol and Decadron were given, this provided no significant improvement per patient after resting for about 20 minutes afterwards.  Do suspect that her right arm pain is more related to a muscular  pain/impingement issue so we will also prescribe Flexeril and discussed supportive home care.  Regarding her chest pain, unclear if related to anxiety, asthma or other etiology.  Cardiology information given she is safe for outpatient follow-up at this time.  Discussed ED if worsening symptoms.  Very low suspicion for emergent causes such as PE, MI at this time.  Final Clinical Impressions(s) / UC Diagnoses   Final diagnoses:  Chest pain, unspecified type  Shortness of breath  Strain of right shoulder, initial encounter  Right arm numbness   Discharge Instructions   None    ED Prescriptions     Medication Sig Dispense Auth. Provider   cyclobenzaprine (FLEXERIL) 10 MG tablet Take 1 tablet (10 mg total) by mouth 3 (three) times daily as needed for muscle spasms. Do not drink alcohol or drive while taking this medication.  May cause drowsiness. 15 tablet Particia Nearing, [place]      PDMP not reviewed this encounter.   Particia Nearing, [place] 09/15/21 1536

## 2021-09-15 NOTE — ED Triage Notes (Signed)
Pt reports chest pain, right arm pain and shortness of breath x 1 month. Chest pain worse when breathing or coughing.

## 2021-10-31 ENCOUNTER — Encounter: Payer: Self-pay | Admitting: Emergency Medicine

## 2021-10-31 ENCOUNTER — Other Ambulatory Visit: Payer: Self-pay

## 2021-10-31 ENCOUNTER — Ambulatory Visit
Admission: EM | Admit: 2021-10-31 | Discharge: 2021-10-31 | Disposition: A | Discharge status: Home / Self Care | Payer: Medicaid Other | Attending: Internal Medicine | Admitting: Internal Medicine

## 2021-10-31 DIAGNOSIS — J069 Acute upper respiratory infection, unspecified: Secondary | ICD-10-CM | POA: Diagnosis present

## 2021-10-31 DIAGNOSIS — J029 Acute pharyngitis, unspecified: Secondary | ICD-10-CM | POA: Diagnosis present

## 2021-10-31 LAB — POCT INFLUENZA A/B
Influenza A, POC: NEGATIVE
Influenza B, POC: NEGATIVE

## 2021-10-31 LAB — POCT RAPID STREP A (OFFICE): Rapid Strep A Screen: NEGATIVE

## 2021-10-31 MED ORDER — BENZONATATE 100 MG PO CAPS: 100.0000 mg | ORAL_CAPSULE | Freq: Three times a day (TID) | ORAL | 0 refills | Status: DC | PRN

## 2021-10-31 MED ORDER — FLUTICASONE PROPIONATE 50 MCG/ACT NA SUSP: 1.0000 | Freq: Every day | NASAL | 0 refills | Status: DC

## 2021-10-31 MED ORDER — ACETAMINOPHEN 325 MG PO TABS
650.0000 mg | ORAL_TABLET | Freq: Once | ORAL | Status: AC
Start: 2021-10-31 — End: 2021-10-31
  Administered 2021-10-31: 650 mg via ORAL

## 2021-10-31 NOTE — ED Provider Notes (Signed)
[facility]    CSN: [id number] Arrival date & time: 10/31/21  0818      History   Chief Complaint Chief Complaint  Patient presents with   Generalized Body Aches    HPI Alice Wagner is a 41 y.o. female.   Patient presents with generalized body aches, sore throat, nasal congestion, cough, bilateral ear pain that has been present for approximately 2 days.  Patient not sure T-max at home but states that she "felt feverish".  Denies any known sick contacts.  Denies chest pain, shortness of breath, nausea, vomiting, diarrhea, abdominal pain.  Patient has taken NyQuil for symptoms with minimal improvement.    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 Authorizing Provider  benzonatate (TESSALON) 100 MG capsule Take 1 capsule (100 mg total) by mouth every 8 (eight) hours as needed for cough. 10/31/21  Yes Jasraj Lappe, Michele Rockers, FNP  fluticasone (FLONASE) 50 MCG/ACT nasal spray Place 1 spray into both nostrils daily for 3 days. 10/31/21 11/03/21 Yes Zoejane Gaulin, Michele Rockers, FNP  cyclobenzaprine (FLEXERIL) 10 MG tablet Take 1 tablet (10 mg total) by mouth 3 (three) times daily as  needed for muscle spasms. Do not drink alcohol or drive while taking this medication.  May cause drowsiness. 09/15/21   Volney American, PA-C  ergocalciferol (VITAMIN D2) 1.25 MG (50000 UT) capsule ergocalciferol (vitamin D2) 1,250 mcg (50,000 unit) capsule  TAKE 1 CAPSULE BY MOUTH ONCE WEEKLY FOR 8 WEEKS FOR 30 DAYS    [provider]  ibuprofen (ADVIL) 800 MG tablet Take 1 tablet (800 mg total) by mouth 3 (three) times daily. 10/25/20   Wieters, Hallie C, PA-C  loratadine (CLARITIN) 10 MG tablet Take 10 mg by mouth daily. 05/21/20   [provider]  meloxicam (MOBIC) 15 MG tablet Take 1 tablet (15 mg total) by mouth daily. 07/14/20   Edrick Kins, DPM  metroNIDAZOLE (FLAGYL) 500 MG tablet Take 1 tablet (500 mg total) by mouth 2 (two) times daily. 09/23/20   Lamptey, Myrene Galas, MD  tiZANidine (ZANAFLEX) 4 MG tablet Take 1 tablet (4 mg total) by mouth every 6 (six) hours as needed for muscle spasms. 10/25/20   Wieters, Hallie C, PA-C  escitalopram (LEXAPRO) 10 MG tablet Take 10 mg by mouth daily. 05/21/20 10/25/20  [provider]    Family History History reviewed. No pertinent  family history.  Social History Social History   Tobacco Use   Smoking status: Former    Years: 6.00    Types: Cigarettes    Quit date: 05/22/2013    Years since quitting: 8.4   Smokeless tobacco: Never  Vaping Use   Vaping Use: Never used  Substance Use Topics   Alcohol use: No   Drug use: No     Allergies   Aloe, Aspirin, and No known allergies   Review of Systems Review of Systems Per HPI  Physical Exam Triage Vital Signs ED Triage Vitals  Enc Vitals Group     BP 10/31/21 0913 123/90     Pulse Rate 10/31/21 0913 (!) 106     Resp 10/31/21 0913 16     Temp 10/31/21 0913 100.3 F (37.9 C)     Temp Source 10/31/21 0913 Oral     SpO2 10/31/21 0913 97 %     Weight --      Height --      Head Circumference --      Peak Flow --      Pain Score 10/31/21 0914 10     Pain  Loc --      Pain Edu? --      Excl. in [place]? --    No data found.  Updated Vital Signs BP 123/90 (BP Location: Left Arm)    Pulse (!) 106    Temp 100.3 F (37.9 C) (Oral)    Resp 16    SpO2 97%   Visual Acuity Right Eye Distance:   Left Eye Distance:   Bilateral Distance:    Right Eye Near:   Left Eye Near:    Bilateral Near:     Physical Exam Constitutional:      General: She is not in acute distress.    Appearance: Normal appearance. She is not toxic-appearing or diaphoretic.  HENT:     Head: Normocephalic and atraumatic.     Right Ear: Tympanic membrane and ear canal normal.     Left Ear: Tympanic membrane and ear canal normal.     Nose: Congestion present.     Mouth/Throat:     Mouth: Mucous membranes are moist.     Pharynx: Posterior oropharyngeal erythema present.  Eyes:     Extraocular Movements: Extraocular movements intact.     Conjunctiva/sclera: Conjunctivae normal.     Pupils: Pupils are equal, round, and reactive to light.  Cardiovascular:     Rate and Rhythm: Normal rate and regular rhythm.     Pulses: Normal pulses.     Heart sounds: Normal heart sounds.  Pulmonary:     Effort: Pulmonary effort is normal. No respiratory distress.     Breath sounds: Normal breath sounds. No stridor. No wheezing, rhonchi or rales.  Abdominal:     General: Abdomen is flat. Bowel sounds are normal.     Palpations: Abdomen is soft.  Musculoskeletal:        General: Normal range of motion.     Cervical back: Normal range of motion.  Skin:    General: Skin is warm and dry.  Neurological:     General: No focal deficit present.     Mental Status: She is alert and oriented to person, place, and time. Mental status is at baseline.  Psychiatric:        Mood and Affect: Mood normal.        Behavior: Behavior normal.     UC Treatments /  Results  Labs (all labs ordered are listed, but only abnormal results are displayed) Labs Reviewed  CULTURE, GROUP A STREP ([place])  NOVEL  CORONAVIRUS, NAA  POCT INFLUENZA A/B  POCT RAPID STREP A (OFFICE)    EKG   Radiology No results found.  Procedures Procedures (including critical care time)  Medications Ordered in UC Medications  acetaminophen (TYLENOL) tablet 650 mg (650 mg Oral Given 10/31/21 0926)    Initial Impression / Assessment and Plan / UC Course  I have reviewed the triage vital signs and the nursing notes.  Pertinent labs & imaging results that were available during my care of the patient were reviewed by me and considered in my medical decision making (see chart for details).     Patient presents with symptoms likely from a viral upper respiratory infection. Differential includes bacterial pneumonia, sinusitis, allergic rhinitis, COVID-19, flu. Do not suspect underlying cardiopulmonary process. Symptoms seem unlikely related to ACS, CHF or COPD exacerbations, pneumonia, pneumothorax. Patient is nontoxic appearing and not in need of emergent medical intervention.  Strep was negative.  Throat culture, COVID-19, flu swab pending.  Recommended symptom control with over the counter medications.  Patient sent prescriptions.  Return if symptoms fail to improve in 1-2 weeks or you develop shortness of breath, chest pain, severe headache. Patient states understanding and is agreeable.  Discharged with PCP followup.  Final Clinical Impressions(s) / UC Diagnoses   Final diagnoses:  Viral upper respiratory tract infection with cough  Sore throat     Discharge Instructions      It appears that you have a viral upper respiratory infection that should resolve on its own in the next few days.  You have been prescribed 2 medications to alleviate symptoms.  No antibiotics are needed as your rapid strep was negative and no signs of bacterial infection on exam.  Rapid flu was negative and COVID-19 test is pending.  We will call if it is positive.    ED Prescriptions     Medication Sig Dispense Auth. Provider    fluticasone (FLONASE) 50 MCG/ACT nasal spray Place 1 spray into both nostrils daily for 3 days. 16 g Takuma Cifelli, Hildred Alamin E, [place]   benzonatate (TESSALON) 100 MG capsule Take 1 capsule (100 mg total) by mouth every 8 (eight) hours as needed for cough. 21 capsule [place], Michele Rockers, [place]      PDMP not reviewed this encounter.   Teodora Medici, [place] 10/31/21 1006

## 2021-10-31 NOTE — Discharge Instructions (Signed)
It appears that you have a viral upper respiratory infection that should resolve on its own in the next few days.  You have been prescribed 2 medications to alleviate symptoms.  No antibiotics are needed as your rapid strep was negative and no signs of bacterial infection on exam.  Rapid flu was negative and COVID-19 test is pending.  We will call if it is positive.

## 2021-10-31 NOTE — ED Triage Notes (Addendum)
Generalized body aches, headache, cough, sore throat, chills, bilateral ear pain since Thursday. Last dose of tylenol/cold medication last night

## 2021-11-01 LAB — NOVEL CORONAVIRUS, NAA: SARS-CoV-2, NAA: DETECTED — AB

## 2021-11-01 LAB — SARS-COV-2, NAA 2 DAY TAT

## 2021-11-03 LAB — CULTURE, GROUP A STREP (THRC)

## 2022-01-08 ENCOUNTER — Encounter (HOSPITAL_COMMUNITY): Payer: Self-pay

## 2022-01-08 ENCOUNTER — Other Ambulatory Visit: Payer: Self-pay

## 2022-01-08 ENCOUNTER — Ambulatory Visit (HOSPITAL_COMMUNITY)
Admission: EM | Admit: 2022-01-08 | Discharge: 2022-01-08 | Disposition: A | Discharge status: Home / Self Care | Payer: Medicaid Other | Attending: Urgent Care | Admitting: Urgent Care

## 2022-01-08 DIAGNOSIS — Z202 Contact with and (suspected) exposure to infections with a predominantly sexual mode of transmission: Secondary | ICD-10-CM | POA: Diagnosis present

## 2022-01-08 MED ORDER — TINIDAZOLE 500 MG PO TABS: 2.0000 g | ORAL_TABLET | Freq: Every day | ORAL | 0 refills | Status: AC

## 2022-01-08 NOTE — ED Triage Notes (Signed)
Pt states her partner was tested and tx'd for Trich. Pt denies any s/sx's.  ?

## 2022-01-08 NOTE — Discharge Instructions (Signed)
You were tested today for gonorrhea, chlamydia, trichomonas, BV, and yeast. ?We will call you with the results of your test once received. ?Tinidazole was called in for you to treat for presumptive trich. Please take 4 pills in a single dose today followed by four pills in a single dose tomorrow. ? ?Please avoid all forms of intercourse until test results received, and if positive for any STI, all partners will need to complete entire course of antibiotics prior to resuming. ?As always, practice safer sexual practices by using protection each and every time, and limiting number of partners.  ?

## 2022-01-08 NOTE — ED Provider Notes (Signed)
?[facility] ? ? ? ?CSN: [id number] ?Arrival date & time: 01/08/22  [id number] ? ? ?  ? ?History   ?Chief Complaint ?Chief Complaint  ?Patient presents with  ? Exposure to STD  ? ? ?HPI ?Alice Wagner is a 41 y.o. female.  ? ?Pleasant 41 year old female presents today with concerns of exposure to trichomonas.  She states she had sexual intercourse with her ex about 1 week ago.  She was recently notified that he is positive for trichomonas.  Patient denies any discharge or itching.  She does state some pelvic cramping, but reports being on her menstrual period currently and states this feels pretty consistent with her usual menses.  Patient denies any additional concerns or complaints. ? ? ?Exposure to STD ? ? ?Past Medical History:  ?Diagnosis Date  ? Ganglion of right wrist   ? Right wrist pain   ? Wears glasses   ? ? ?Patient Active Problem List  ? Diagnosis Date Noted  ? Aftercare 06/08/2018  ? 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 ? ?Past Surgical History:  ?Procedure Laterality Date  ? CESAREAN SECTION  2012;  10-08-2009 ;  01-07-2005  ? WRIST ARTHROSCOPY Right 05/29/2015  ? Procedure: RIGHT WRIST ARTHROSCOPY DEBRIDEMENT, ARTHROSCOPIC GANGLION CYST DECOMPRESSION;  Surgeon: Iran Planas, MD;  Location: [place];  Service: Orthopedics;  Laterality: Right;  ? ? ?OB History   ? ? Gravida  ?4  ? Para  ?2  ? Term  ?2  ? Preterm  ?   ? AB  ?2  ? Living  ?2  ?  ? ? SAB  ?2  ? IAB  ?   ? Ectopic  ?   ? Multiple  ?   ? Live Births  ?2  ?   ?  ?  ? ? ? ?Home Medications   ? ?Prior to Admission medications   ?Medication Sig Start Date End Date Taking? Authorizing Provider  ?tinidazole (TINDAMAX) 500 MG tablet Take 4 tablets (2,000 mg total) by mouth daily with breakfast for 2 days. 01/08/22 01/10/22 Yes Clark Clowdus L, PA  ?ergocalciferol (VITAMIN D2) 1.25 MG (50000 UT) capsule ergocalciferol (vitamin D2) 1,250 mcg (50,000 unit) capsule ? TAKE 1 CAPSULE BY MOUTH ONCE WEEKLY FOR 8 WEEKS FOR  30 DAYS    [provider]  ?loratadine (CLARITIN) 10 MG tablet Take 10 mg by mouth daily. 05/21/20   [provider]  ?escitalopram (LEXAPRO) 10 MG tablet Take 10 mg by mouth daily. 05/21/20 10/25/20  [provider]  ? ? ?Family History ?History reviewed. No pertinent family history. ? ?Social History ?Social History  ? ?Tobacco Use  ? Smoking status: Former  ?  Years: 6.00  ?  Types: Cigarettes  ?  Quit date: 05/22/2013  ?  Years since quitting: 8.6  ? Smokeless tobacco: Never  ?Vaping Use  ? Vaping Use: Never used  ?Substance Use Topics  ? Alcohol use: No  ? Drug use: No  ? ? ? ?Allergies   ?Aloe, Aspirin, and No known allergies ? ? ?Review of Systems ?Review of Systems  ?All other systems reviewed and are negative. ? ? ?Physical Exam ?Triage Vital Signs ?ED Triage Vitals [01/08/22 0818]  ?Enc Vitals Group  ?   BP 127/84  ?   Pulse Rate (!) 108  ?   Resp 18  ?   Temp 98.1 ?F (36.7 ?C)  ?   Temp Source Oral  ?   SpO2 100 %  ?  Weight   ?   Height   ?   Head Circumference   ?   Peak Flow   ?   Pain Score 0  ?   Pain Loc   ?   Pain Edu?   ?   Excl. in [place]?   ? ?No data found. ? ?Updated Vital Signs ?BP 127/84 (BP Location: Left Arm)   Pulse (!) 108   Temp 98.1 ?F (36.7 ?C) (Oral)   Resp 18   LMP 01/04/2022   SpO2 100%  ? ?Visual Acuity ?Right Eye Distance:   ?Left Eye Distance:   ?Bilateral Distance:   ? ?Right Eye Near:   ?Left Eye Near:    ?Bilateral Near:    ? ?Physical Exam ?Vitals and nursing note reviewed.  ?Constitutional:   ?   General: She is not in acute distress. ?   Appearance: Normal appearance. She is normal weight. She is not ill-appearing, toxic-appearing or diaphoretic.  ?HENT:  ?   Head: Normocephalic and atraumatic.  ?Cardiovascular:  ?   Rate and Rhythm: Tachycardia present.  ?Pulmonary:  ?   Effort: Pulmonary effort is normal. No respiratory distress.  ?Abdominal:  ?   General: Abdomen is flat. Bowel sounds are normal. There is no distension.  ?   Palpations: Abdomen is  soft. There is no mass.  ?   Tenderness: There is no abdominal tenderness. There is no right CVA tenderness, left CVA tenderness, guarding or rebound.  ?   Hernia: No hernia is present.  ?Genitourinary: ?   Comments: No pelvic or adnexal tenderness ?Skin: ?   General: Skin is warm.  ?   Coloration: Skin is not jaundiced.  ?   Findings: No rash.  ?Neurological:  ?   General: No focal deficit present.  ?   Mental Status: She is alert and oriented to person, place, and time.  ?Psychiatric:     ?   Mood and Affect: Mood normal.     ?   Behavior: Behavior normal.  ? ? ? ?UC Treatments / Results  ?Labs ?(all labs ordered are listed, but only abnormal results are displayed) ?Labs Reviewed  ?CERVICOVAGINAL ANCILLARY ONLY  ? ? ?EKG ? ? ?Radiology ?No results found. ? ?Procedures ?Procedures (including critical care time) ? ?Medications Ordered in UC ?Medications - No data to display ? ?Initial Impression / Assessment and Plan / UC Course  ?I have reviewed the triage vital signs and the nursing notes. ? ?Pertinent labs & imaging results that were available during my care of the patient were reviewed by me and considered in my medical decision making (see chart for details). ? ?  ? ?Exposure to trichomonas - pt denies any active symptoms. She is requesting the 2 day tx vs the 7 day treatment. States she is "terrible about taking pills" and is concerned she will not complete the treatment if the 7 day course. Will do tinidazole 2g x 2 days. Side effect profile reviewed. Will call with result of swab ? ?Final Clinical Impressions(s) / UC Diagnoses  ? ?Final diagnoses:  ?Exposure to trichomonas  ? ? ? ?Discharge Instructions   ? ?  ?You were tested today for gonorrhea, chlamydia, trichomonas, BV, and yeast. ?We will call you with the results of your test once received. ?Tinidazole was called in for you to treat for presumptive trich. Please take 4 pills in a single dose today followed by four pills in a single dose  tomorrow. ? ?Please avoid  all forms of intercourse until test results received, and if positive for any STI, all partners will need to complete entire course of antibiotics prior to resuming. ?As always, practice safer sexual practices by using protection each and every time, and limiting number of partners.  ? ? ? ?ED Prescriptions   ? ? Medication Sig Dispense Auth. Provider  ? tinidazole (TINDAMAX) 500 MG tablet Take 4 tablets (2,000 mg total) by mouth daily with breakfast for 2 days. 8 tablet [place], [place] L, PA  ? ?  ? ?PDMP not reviewed this encounter. ?  Chaney Malling, [place] ?01/08/22 [id number] ? ?

## 2022-01-11 ENCOUNTER — Telehealth (HOSPITAL_COMMUNITY): Payer: Self-pay | Admitting: Emergency Medicine

## 2022-01-11 LAB — CERVICOVAGINAL ANCILLARY ONLY
Bacterial Vaginitis (gardnerella): POSITIVE — AB
Candida Glabrata: NEGATIVE
Candida Vaginitis: NEGATIVE
Chlamydia: NEGATIVE
Comment: NEGATIVE
Comment: NEGATIVE
Comment: NEGATIVE
Comment: NEGATIVE
Comment: NEGATIVE
Comment: NORMAL
Neisseria Gonorrhea: NEGATIVE
Trichomonas: NEGATIVE

## 2022-01-11 MED ORDER — METRONIDAZOLE 500 MG PO TABS: 500.0000 mg | ORAL_TABLET | Freq: Two times a day (BID) | ORAL | 0 refills | Status: DC

## 2022-08-07 ENCOUNTER — Encounter (HOSPITAL_COMMUNITY): Payer: Self-pay

## 2022-08-07 ENCOUNTER — Ambulatory Visit (HOSPITAL_COMMUNITY)
Admission: EM | Admit: 2022-08-07 | Discharge: 2022-08-07 | Disposition: A | Discharge status: Home / Self Care | Payer: Medicaid Other | Attending: Emergency Medicine | Admitting: Emergency Medicine

## 2022-08-07 DIAGNOSIS — Z202 Contact with and (suspected) exposure to infections with a predominantly sexual mode of transmission: Secondary | ICD-10-CM | POA: Insufficient documentation

## 2022-08-07 DIAGNOSIS — N898 Other specified noninflammatory disorders of vagina: Secondary | ICD-10-CM | POA: Diagnosis not present

## 2022-08-07 MED ORDER — DOXYCYCLINE HYCLATE 100 MG PO CAPS
100.0000 mg | ORAL_CAPSULE | Freq: Two times a day (BID) | ORAL | 0 refills | Status: DC
Start: 2022-08-07 — End: 2024-02-04

## 2022-08-07 MED ORDER — LIDOCAINE HCL (PF) 1 % IJ SOLN
INTRAMUSCULAR | Status: AC
  Filled 2022-08-07: qty 2

## 2022-08-07 MED ORDER — CEFTRIAXONE SODIUM 500 MG IJ SOLR
INTRAMUSCULAR | Status: AC
  Filled 2022-08-07: qty 500

## 2022-08-07 MED ORDER — CEFTRIAXONE SODIUM 500 MG IJ SOLR
500.0000 mg | Freq: Once | INTRAMUSCULAR | Status: AC
  Administered 2022-08-07: 500 mg via INTRAMUSCULAR

## 2022-08-07 MED ORDER — METRONIDAZOLE 500 MG PO TABS: 500.0000 mg | ORAL_TABLET | Freq: Two times a day (BID) | ORAL | 0 refills | Status: DC

## 2022-08-07 NOTE — ED Notes (Signed)
Cervicovaginal swab in lab.  Verified labeling and liquid in vial, foil lid intact

## 2022-08-07 NOTE — ED Provider Notes (Signed)
[facility]    CSN: [id number] Arrival date & time: 08/07/22  1019      History   Chief Complaint Chief Complaint  Patient presents with   Exposure to STD    HPI Alice Wagner is a 41 y.o. female.  Patient presents due to STD exposure to gonorrhea and trichomoniasis.  Patient reports abnormal vaginal odor.  Patient denies any change to vaginal discharge from normal baseline.  Patient denies any pelvic or abdominal pain.  Patient reports having 1 female sexual partner in the past 3 months with unprotected sexual intercourse.  Patient denies taking any medications for symptoms.    Exposure to STD Pertinent negatives include no abdominal pain.    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 Authorizing Provider  doxycycline (VIBRAMYCIN) 100 MG capsule Take 1 capsule (100 mg total) by mouth 2 (two) times daily. 08/07/22  Yes Flossie Dibble, NP  metroNIDAZOLE (FLAGYL) 500 MG tablet Take 1 tablet (500 mg total) by mouth 2 (two) times daily. 08/07/22  Yes Flossie Dibble, NP  ergocalciferol (VITAMIN D2) 1.25 MG (50000 UT) capsule ergocalciferol (vitamin D2)  1,250 mcg (50,000 unit) capsule  TAKE 1 CAPSULE BY MOUTH ONCE WEEKLY FOR 8 WEEKS FOR 30 DAYS    [provider]  loratadine (CLARITIN) 10 MG tablet Take 10 mg by mouth daily. 05/21/20   [provider]  escitalopram (LEXAPRO) 10 MG tablet Take 10 mg by mouth daily. 05/21/20 10/25/20  [provider]    Family History History reviewed. No pertinent family history.  Social History Social History   Tobacco Use   Smoking status: Former    Years: 6.00    Types: Cigarettes    Quit date: 05/22/2013    Years since quitting: 9.2   Smokeless tobacco: Never  Vaping Use   Vaping Use: Never used  Substance Use Topics   Alcohol use: No   Drug use: No     Allergies   Aloe, Aspirin, and No known allergies   Review of Systems Review of Systems  Gastrointestinal:  Negative for abdominal pain, constipation, diarrhea, nausea and vomiting.  Genitourinary:  Positive for vaginal discharge (Reports discharge is per normal baseline). Negative for decreased urine volume, difficulty urinating, dyspareunia, dysuria, enuresis, flank pain, frequency, genital sores, hematuria, menstrual problem, pelvic pain, urgency, vaginal bleeding and vaginal pain.  Reports abnormal vaginal odor     Physical Exam Triage Vital Signs ED Triage Vitals [08/07/22 1156]  Enc Vitals Group     BP (!) 160/88     Pulse Rate (!) 109     Resp 18     Temp 98.6 F (37 C)     Temp Source Oral     SpO2 99 %     Weight      Height      Head Circumference      Peak Flow      Pain Score      Pain Loc      Pain Edu?      Excl. in [place]?    No data found.  Updated Vital Signs BP (!) 160/88 (BP Location: Left Arm)   Pulse (!) 109   Temp 98.6 F (37 C) (Oral)   Resp 18   SpO2 99%      Physical Exam Vitals and nursing note reviewed.  Constitutional:      Appearance: Normal appearance.  Genitourinary:    Comments: Deferred Exam Neurological:     Mental Status: She is alert.  Psychiatric:         Mood and Affect: Mood is anxious. Affect is tearful.      UC Treatments / Results  Labs (all labs ordered are listed, but only abnormal results are displayed) Labs Reviewed  CERVICOVAGINAL ANCILLARY ONLY    EKG   Radiology No results found.  Procedures Procedures (including critical care time)  Medications Ordered in UC Medications  cefTRIAXone (ROCEPHIN) injection 500 mg (has no administration in time range)    Initial Impression / Assessment and Plan / UC Course  I have reviewed the triage vital signs and the nursing notes.  Pertinent labs & imaging results that were available during my care of the patient were reviewed by me and considered in my medical decision making (see chart for details).     Patient was evaluated for STD exposure and abnormal vaginal odor.  Patient was given Rocephin injection in office.  Flagyl was sent to the pharmacy.  Patient request doxycycline antibiotic due to not trusting her partner and feeling as though her partner not truthful about all the potential STDs he tested positive for.  This Probation officer educated patient on why it would be prudent to wait for vaginal swab if we are unsure that her partner had chlamydia.  Patient refused stating that she wants to get treated for everything and became agitated.  This Probation officer began to feel concerned and worried about the patient becoming angry.  Upon the situation, writer decided to send doxycycline to the pharmacy, I educated the patient that if the test result came back negative she would need to discontinue doxycycline use.  I made the patient aware of potential antimicrobial resistance in the future.  Patient was offered HIV and syphilis testing, declined this testing, stating that she has already been tested for this recently.  Patient was made aware of safe sex practices and to refrain from any sexual activity.  She was made aware of results reporting protocol. Patient verbalized understanding of  instructions.  Final Clinical Impressions(s) / UC Diagnoses   Final diagnoses:  STD exposure  Vaginal odor     Discharge Instructions      As discussed, the vaginal swab has been sent off and will take 2 to 3 days to get the results back.  We will call you if the test results are  positive or if the test results warrant a change in your plan of care.  You can view all your test results on MyChart.   At this time please refrain from any sexual intercourse for the next 7 days and until completion of the antibiotics.   2 antibiotics have been sent to the pharmacy.  Of the antibiotics is called doxycycline, you will take this medication 2 times daily for the next 7 days.  Another medication is called Flagyl, take this medication 2 times daily for the next 7 days.  Please refrain drink from drinking any alcohol because you can get very sick due to Flagyl interacting with alcohol.    ED Prescriptions     Medication Sig Dispense Auth. Provider   doxycycline (VIBRAMYCIN) 100 MG capsule Take 1 capsule (100 mg total) by mouth 2 (two) times daily. 14 capsule Flossie Dibble, NP   metroNIDAZOLE (FLAGYL) 500 MG tablet Take 1 tablet (500 mg total) by mouth 2 (two) times daily. 14 tablet Flossie Dibble, NP      PDMP not reviewed this encounter.   Flossie Dibble, NP 08/07/22 1304

## 2022-08-07 NOTE — Discharge Instructions (Addendum)
As discussed, the vaginal swab has been sent off and will take 2 to 3 days to get the results back.  We will call you if the test results are positive or if the test results warrant a change in your plan of care.  You can view all your test results on MyChart.   At this time please refrain from any sexual intercourse for the next 7 days and until completion of the antibiotics.   2 antibiotics have been sent to the pharmacy.  Of the antibiotics is called doxycycline, you will take this medication 2 times daily for the next 7 days.  Another medication is called Flagyl, take this medication 2 times daily for the next 7 days.  Please refrain drink from drinking any alcohol because you can get very sick due to Flagyl interacting with alcohol.

## 2022-08-07 NOTE — ED Triage Notes (Signed)
Pt reports exposure to gonorrhea and trich. She reports her PH balance is off.

## 2022-08-09 LAB — CERVICOVAGINAL ANCILLARY ONLY
Bacterial Vaginitis (gardnerella): NEGATIVE
Candida Glabrata: NEGATIVE
Candida Vaginitis: NEGATIVE
Chlamydia: NEGATIVE
Comment: NEGATIVE
Comment: NEGATIVE
Comment: NEGATIVE
Comment: NEGATIVE
Comment: NEGATIVE
Comment: NORMAL
Neisseria Gonorrhea: POSITIVE — AB
Trichomonas: NEGATIVE

## 2022-08-18 IMAGING — DX DG CHEST 2V
2 series · 2 of 2 positions shown · non-contrast
Comparison: May 19, 2019.

CLINICAL DATA: Chest pain, shortness of breath.

EXAM:
CHEST - 2 VIEW

[chest pa]
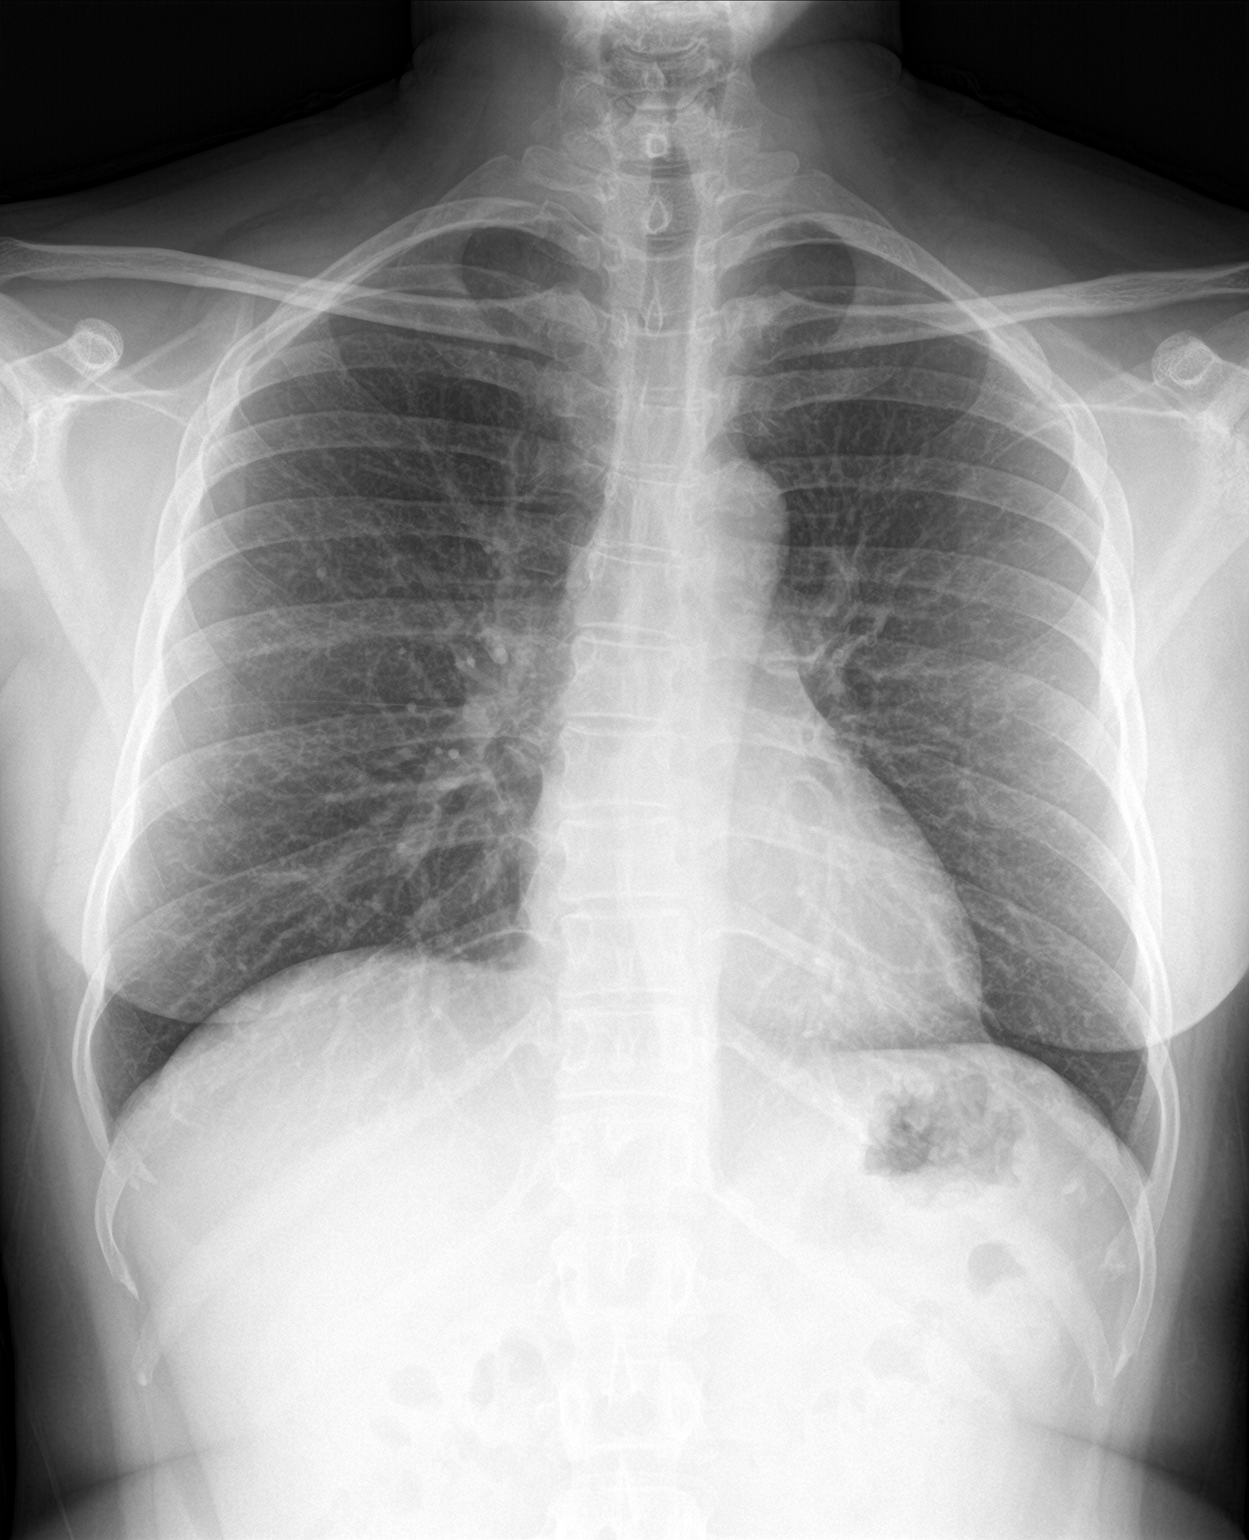

[chest lat]
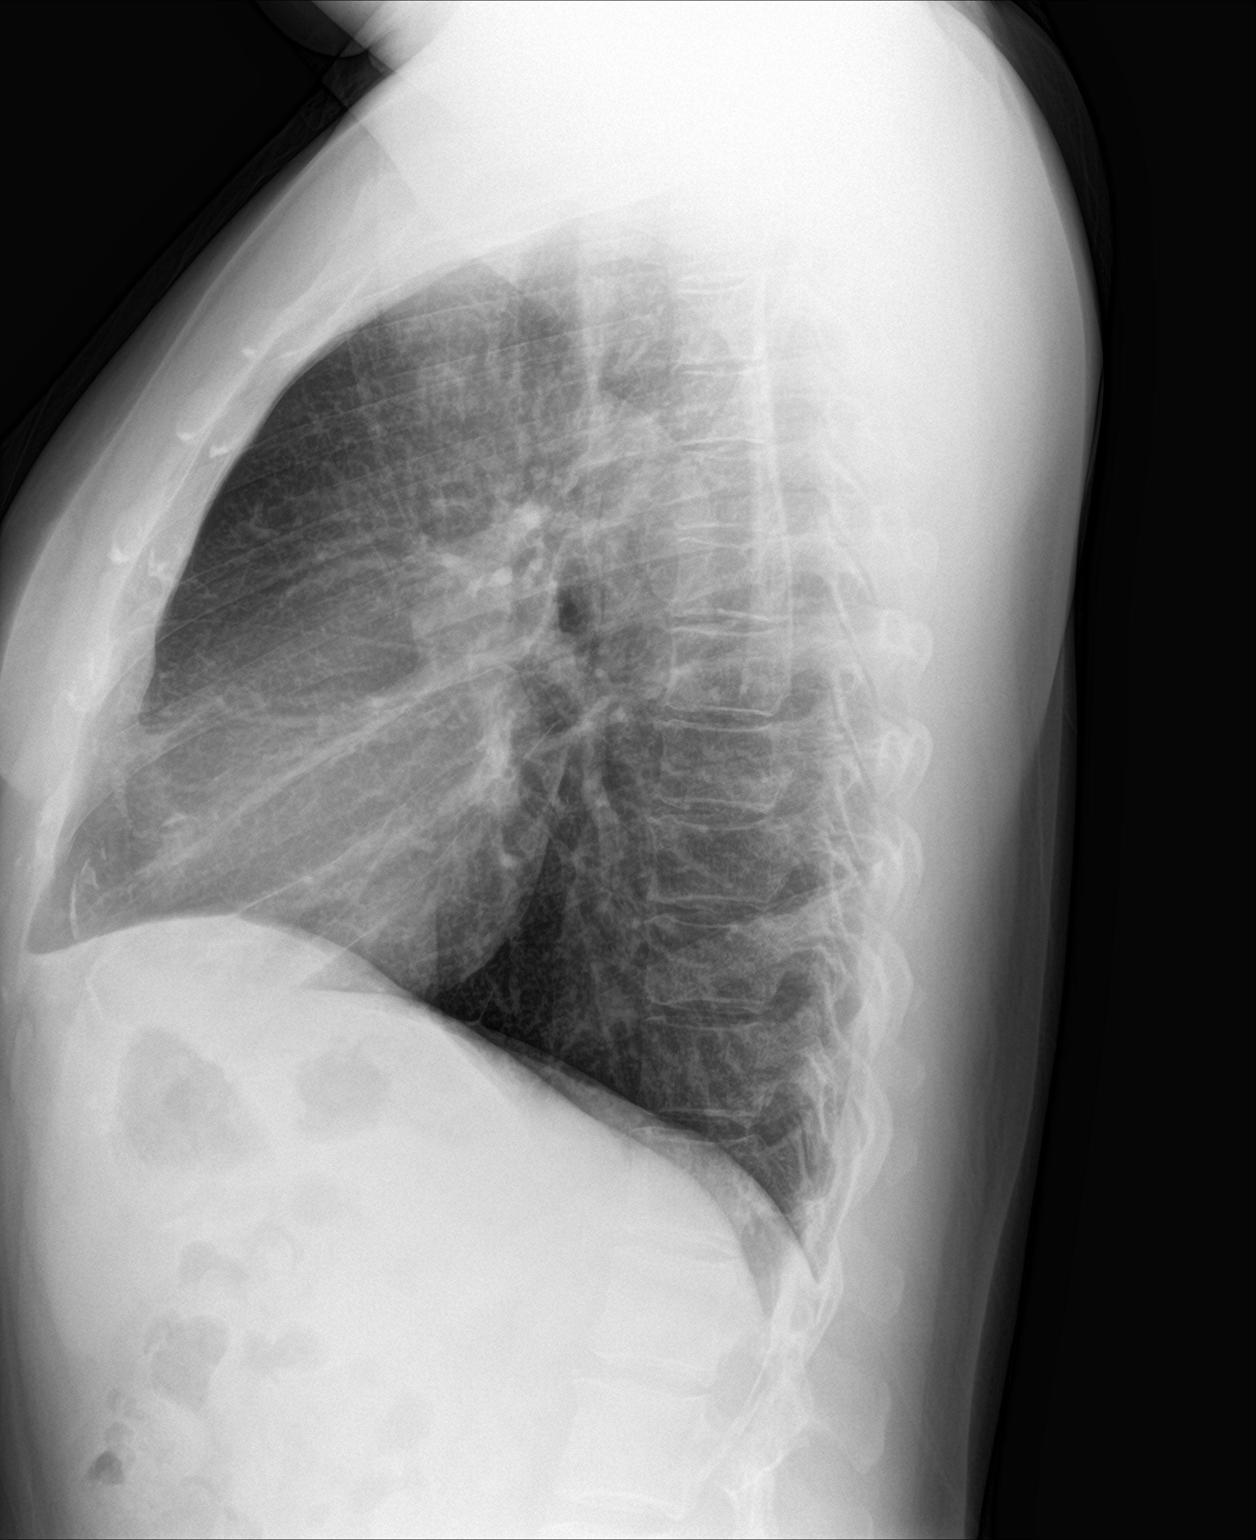

[2 of 2 positions shown; findings below may reference images not displayed]

FINDINGS: The heart size and mediastinal contours are within normal limits.
Both lungs are clear. The visualized skeletal structures are
unremarkable.
IMPRESSION: No active cardiopulmonary disease.

## 2022-08-30 ENCOUNTER — Encounter (HOSPITAL_COMMUNITY): Payer: Self-pay | Admitting: Emergency Medicine

## 2022-08-30 ENCOUNTER — Ambulatory Visit (HOSPITAL_COMMUNITY)
Admission: EM | Admit: 2022-08-30 | Discharge: 2022-08-30 | Disposition: A | Discharge status: Home / Self Care | Payer: Medicaid Other | Attending: Emergency Medicine | Admitting: Emergency Medicine

## 2022-08-30 ENCOUNTER — Other Ambulatory Visit: Payer: Self-pay

## 2022-08-30 DIAGNOSIS — J069 Acute upper respiratory infection, unspecified: Secondary | ICD-10-CM | POA: Diagnosis present

## 2022-08-30 DIAGNOSIS — J45909 Unspecified asthma, uncomplicated: Secondary | ICD-10-CM | POA: Insufficient documentation

## 2022-08-30 DIAGNOSIS — Z1152 Encounter for screening for COVID-19: Secondary | ICD-10-CM | POA: Insufficient documentation

## 2022-08-30 HISTORY — DX: Unspecified asthma, uncomplicated: J45.909

## 2022-08-30 LAB — RESP PANEL BY RT-PCR (FLU A&B, COVID) ARPGX2
Influenza A by PCR: NEGATIVE
Influenza B by PCR: NEGATIVE
SARS Coronavirus 2 by RT PCR: NEGATIVE

## 2022-08-30 MED ORDER — VENTOLIN HFA 108 (90 BASE) MCG/ACT IN AERS: 1.0000 | INHALATION_SPRAY | Freq: Four times a day (QID) | RESPIRATORY_TRACT | 0 refills | Status: AC | PRN

## 2022-08-30 NOTE — Discharge Instructions (Signed)
We will call you if your covid/flu test returns positive.   Try mucinex 600 mg twice daily with lots of water/fluids  Use nebulizer or albuterol inhaler every 4-6 hours for the next few days  Please go to the emergency department if symptoms worsen.

## 2022-08-30 NOTE — ED Provider Notes (Signed)
[place]    CSN: [id number] Arrival date & time: 08/30/22  [id number]      History   Chief Complaint Chief Complaint  Patient presents with   Cough    HPI Alice Wagner is a 41 y.o. female.  Presents with 2 day history of cough, body aches, chills Chest hurts with coughing, sore throat Reports 101 fever last night, no medications used  History of asthma Has been using albuterol inhaler Son is sick with similar  Work note requested  Past Medical History:  Diagnosis Date   Asthma    Ganglion of right wrist    Right wrist pain    Wears glasses     Patient Active Problem List   Diagnosis Date Noted   Aftercare 06/08/2018   Tear of triangular fibrocartilage 05/24/2018    Past Surgical History:  Procedure Laterality Date   CESAREAN SECTION  2012;  10-08-2009 ;  01-07-2005   WRIST ARTHROSCOPY Right 05/29/2015   Procedure: RIGHT WRIST ARTHROSCOPY DEBRIDEMENT, ARTHROSCOPIC GANGLION CYST DECOMPRESSION;  Surgeon: Iran Planas, MD;  Location: [facility];  Service: Orthopedics;  Laterality: Right;    OB History     Gravida  4   Para  2   Term  2   Preterm      AB  2   Living  2      SAB  2   IAB      Ectopic      Multiple      Live Births  2            Home Medications    Prior to Admission medications   Medication Sig Start Date End Date Taking? Authorizing Provider  doxycycline (VIBRAMYCIN) 100 MG capsule Take 1 capsule (100 mg total) by mouth 2 (two) times daily. Patient not taking: Reported on 08/30/2022 08/07/22   Flossie Dibble, NP  ergocalciferol (VITAMIN D2) 1.25 MG (50000 UT) capsule ergocalciferol (vitamin D2) 1,250 mcg (50,000 unit) capsule  TAKE 1 CAPSULE BY MOUTH ONCE WEEKLY FOR 8 WEEKS FOR 30 DAYS    [provider]  loratadine (CLARITIN) 10 MG tablet Take 10 mg by mouth daily. 05/21/20   [provider]  metroNIDAZOLE (FLAGYL) 500 MG tablet Take 1 tablet (500 mg total) by mouth  2 (two) times daily. Patient not taking: Reported on 08/30/2022 08/07/22   Flossie Dibble, NP  VENTOLIN HFA 108 (90 Base) MCG/ACT inhaler Inhale 1 puff into the lungs every 6 (six) hours as needed for wheezing or shortness of breath. 08/30/22   Tazaria Dlugosz, Wells Guiles, PA-C  escitalopram (LEXAPRO) 10 MG tablet Take 10 mg by mouth daily. 05/21/20 10/25/20  [provider]    Family History History reviewed. No pertinent family history.  Social History Social History   Tobacco Use   Smoking status: Former    Years: 6.00    Types: Cigarettes    Quit date: 05/22/2013    Years since quitting: 9.2   Smokeless tobacco: Never  Vaping Use   Vaping Use: Never used  Substance Use Topics   Alcohol use: No   Drug use: No     Allergies   Aloe, Aspirin, and No known allergies   Review of Systems Review of Systems  Respiratory:  Positive for cough.    Per HPI  Physical Exam Triage Vital Signs ED Triage Vitals  Enc Vitals Group     BP      Pulse  Resp      Temp      Temp src      SpO2      Weight      Height      Head Circumference      Peak Flow      Pain Score      Pain Loc      Pain Edu?      Excl. in [place]?    No data found.  Updated Vital Signs BP 111/76 (BP Location: Left Arm)   Pulse 78   Temp 98.3 F (36.8 C) (Oral)   Resp 20   SpO2 97%    Physical Exam Vitals and nursing note reviewed.  Constitutional:      General: She is not in acute distress. HENT:     Mouth/Throat:     Mouth: Mucous membranes are moist.     Pharynx: Uvula midline. Posterior oropharyngeal erythema present.     Tonsils: No tonsillar exudate or tonsillar abscesses.  Eyes:     Pupils: Pupils are equal, round, and reactive to light.  Cardiovascular:     Rate and Rhythm: Normal rate and regular rhythm.     Heart sounds: Normal heart sounds.  Pulmonary:     Effort: Pulmonary effort is normal. No respiratory distress.     Breath sounds: Normal breath sounds. No wheezing.      Comments: Clear throughout. Dry cough in clinic Abdominal:     General: Bowel sounds are normal.     Palpations: Abdomen is soft.     Tenderness: There is no abdominal tenderness.  Musculoskeletal:     Cervical back: Normal range of motion.  Lymphadenopathy:     Cervical: No cervical adenopathy.  Neurological:     Mental Status: She is alert and oriented to person, place, and time.     UC Treatments / Results  Labs (all labs ordered are listed, but only abnormal results are displayed) Labs Reviewed  RESP PANEL BY RT-PCR (FLU A&B, COVID) [id number]    EKG  Radiology No results found.  Procedures Procedures  Medications Ordered in UC Medications - No data to display  Initial Impression / Assessment and Plan / UC Course  I have reviewed the triage vital signs and the nursing notes.  Pertinent labs & imaging results that were available during my care of the patient were reviewed by me and considered in my medical decision making (see chart for details).  Vitals stable, well appearing. No resp distress Clear lungs  Viral URI with asthma, no acute exacerbation Covid/flu pending. If covid pos, candidate for antivirals. GFR >60 in 2020  Discussed symptomatic care Mucinex, nebulizer, fluids Return precautions discussed. Patient agrees to plan  Final Clinical Impressions(s) / UC Diagnoses   Final diagnoses:  Viral URI with cough  Uncomplicated asthma, unspecified asthma severity, unspecified whether persistent     Discharge Instructions      We will call you if your covid/flu test returns positive.   Try mucinex 600 mg twice daily with lots of water/fluids  Use nebulizer or albuterol inhaler every 4-6 hours for the next few days  Please go to the emergency department if symptoms worsen.      ED Prescriptions     Medication Sig Dispense Auth. Provider   VENTOLIN HFA 108 (90 Base) MCG/ACT inhaler Inhale 1 puff into the lungs every 6 (six) hours as needed for  wheezing or shortness of breath. 18 g Icelyn Navarrete, Wells Guiles, PA-C  PDMP not reviewed this encounter.   Marlow Baars, [place] 08/30/22 0388

## 2022-08-30 NOTE — ED Triage Notes (Signed)
Symptoms started Saturday after an asthma attack.  Complains of general body aches, chills, cough.  Patient is here with another family member that is being seen for the same.  Reports chest hurts with coughing.  Pain is in chest and back.    Has taken albuterol, nyquil.

## 2023-03-24 DIAGNOSIS — Z1231 Encounter for screening mammogram for malignant neoplasm of breast: Secondary | ICD-10-CM

## 2023-03-25 ENCOUNTER — Other Ambulatory Visit: Payer: Self-pay | Admitting: Physician Assistant

## 2023-03-25 DIAGNOSIS — Z1231 Encounter for screening mammogram for malignant neoplasm of breast: Secondary | ICD-10-CM

## 2023-04-04 ENCOUNTER — Ambulatory Visit (INDEPENDENT_AMBULATORY_CARE_PROVIDER_SITE_OTHER): Payer: Medicaid Other

## 2023-04-04 ENCOUNTER — Ambulatory Visit (INDEPENDENT_AMBULATORY_CARE_PROVIDER_SITE_OTHER): Payer: Medicaid Other | Admitting: Podiatry

## 2023-04-04 DIAGNOSIS — M722 Plantar fascial fibromatosis: Secondary | ICD-10-CM

## 2023-04-04 MED ORDER — METHYLPREDNISOLONE 4 MG PO TBPK
ORAL_TABLET | ORAL | 0 refills | Status: DC
Start: 2023-04-04 — End: 2024-02-04

## 2023-04-04 MED ORDER — MELOXICAM 15 MG PO TABS
15.0000 mg | ORAL_TABLET | Freq: Every day | ORAL | 1 refills | Status: DC
Start: 2023-04-04 — End: 2024-02-04

## 2023-04-04 MED ORDER — BETAMETHASONE SOD PHOS & ACET 6 (3-3) MG/ML IJ SUSP
3.0000 mg | Freq: Once | INTRAMUSCULAR | Status: AC
Start: 2023-04-04 — End: 2023-04-04
  Administered 2023-04-04: 3 mg via INTRA_ARTICULAR

## 2023-04-04 NOTE — Progress Notes (Signed)
   Chief Complaint  Patient presents with   Foot Pain    "My feet have started hurting again and my feet are swelling."    Subjective: 42 y.o. female presenting today as a reestablish new patient for evaluation of bilateral plantar fasciitis.  Patient has history of plantar fasciitis and Achilles tendinitis.  Currently the Achilles tendons are asymptomatic.  She says that she developed pain and tenderness associated to the plantar heels over the past month or two.  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, theatre/television/film at [employer]   Past Medical History:  Diagnosis Date   Asthma    Ganglion of right wrist    Right wrist pain    Wears glasses    Past Surgical History:  Procedure Laterality Date   CESAREAN SECTION  2012;  10-08-2009 ;  01-07-2005   WRIST ARTHROSCOPY Right 05/29/2015   Procedure: RIGHT WRIST ARTHROSCOPY DEBRIDEMENT, ARTHROSCOPIC GANGLION CYST DECOMPRESSION;  Surgeon: Bradly Bienenstock, MD;  Location: [facility];  Service: Orthopedics;  Laterality: Right;   Allergies  Allergen Reactions   Aloe Other (See Comments)    edema   Aspirin     Unknown   No Known Allergies      Objective: Physical Exam General: The patient is alert and oriented x3 in no acute distress.  Dermatology: Skin is warm, dry and supple bilateral lower extremities. Negative for open lesions or macerations bilateral.   Vascular: Dorsalis Pedis and Posterior Tibial pulses palpable bilateral.  Capillary fill time is immediate to all digits.  Neurological: Grossly intact via light touch  Musculoskeletal: Tenderness to palpation to the plantar aspect of the bilateral heels along the plantar fascia. All other joints range of motion within normal limits bilateral. Strength 5/5 in all groups bilateral.   Radiographic exam: Normal osseous mineralization. Joint spaces preserved. No fracture/dislocation/boney destruction. No other soft tissue abnormalities or radiopaque foreign bodies.    Assessment: 1. plantar fasciitis bilateral feet  Plan of Care:  -Patient evaluated -Injection of 0.5 cc Celestone Soluspan injected into the bilateral plantar fascia -Prescription for Medrol Dosepak -Prescription for meloxicam 15 mg daily after completion of the Dosepak -Advised against going barefoot.  At work she currently wears sket[employer] memory foam.  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 Felecia Shelling, DPM    [address] [place], [place] [address]                Office [phone]  Fax [phone]

## 2023-04-11 ENCOUNTER — Ambulatory Visit (INDEPENDENT_AMBULATORY_CARE_PROVIDER_SITE_OTHER): Payer: Medicaid Other | Admitting: Physician Assistant

## 2023-04-11 ENCOUNTER — Encounter: Payer: Self-pay | Admitting: Physician Assistant

## 2023-04-11 ENCOUNTER — Other Ambulatory Visit (INDEPENDENT_AMBULATORY_CARE_PROVIDER_SITE_OTHER): Payer: Medicaid Other

## 2023-04-11 VITALS — Ht 65.0 in | Wt 170.0 lb

## 2023-04-11 DIAGNOSIS — G8929 Other chronic pain: Secondary | ICD-10-CM

## 2023-04-11 DIAGNOSIS — M545 Low back pain, unspecified: Secondary | ICD-10-CM

## 2023-04-11 MED ORDER — METHOCARBAMOL 500 MG PO TABS: 500.0000 mg | ORAL_TABLET | Freq: Four times a day (QID) | ORAL | 0 refills | Status: DC | PRN

## 2023-04-11 NOTE — Progress Notes (Signed)
Office Visit Note   Patient: Alice Wagner           Date of Birth: 01/09/1981           MRN: [id number] Visit Date: 04/11/2023              Requested by: [facility] [address] [place],  [place] [address] PCP: [facility]   Assessment & Plan: Visit Diagnoses:  1. Chronic bilateral low back pain, unspecified whether sciatica present     Plan: Patient is a pleasant 42 year old woman with a chief complaint of low back pain rating into her left buttock.  She said she injured her back originally at work a couple years ago when a box fell on her.  She no longer works at this job but was given some lidocaine patches which seem to help.  She now is busy as a Production designer, theatre/television/film at [employer].  And on her feet all the time.  She denies any loss of bowel or bladder control she does feel like she has a little bit more swelling on the left side.  She was seen by her primary care provider and she is going to start physical therapy today I think that is a great idea.  She also recently visited her podiatrist who had prescribed her a Medrol Dosepak for some plantar fasciitis.  I told her this would be beneficial for her back as well but not to take it with other anti-inflammatories and to take it with food.  We also discussed using a little Voltaren gel on her posterior buttock where she is particularly tender.  She will see how she does in the course of the next 6 weeks of therapy.  If she does not improve she will contact me and I will arrange for an MRI    Follow-Up Instructions: No follow-ups on file.   Orders:  Orders Placed This Encounter  Procedures   XR Lumbar Spine 2-3 Views   Meds ordered this encounter  Medications   methocarbamol (ROBAXIN) 500 MG tablet    Sig: Take 1 tablet (500 mg total) by mouth every 6 (six) hours as needed for muscle spasms.    Dispense:  30 tablet    Refill:  0      Procedures: No procedures  performed   Clinical Data: No additional findings.   Subjective: Chief Complaint  Patient presents with   Lower Back - Pain    HPI patient is a pleasant 42 year old woman who comes in today with low back pain radiating more into her left than her right.  Denies any paresthesias or radiation of symptoms below her thigh.  Denies any weakness or giving way.  She says she originally injured her back a few years ago when a box fell on her.  She works on her feet all day as a Production designer, theatre/television/film at a [employer].  Rates the pain as moderate especially after being on her feet all day  Review of Systems  All other systems reviewed and are negative.    Objective: Vital Signs: Ht 5\' 5"  (1.651 m)   Wt 170 lb (77.1 kg)   LMP 03/29/2023 (Approximate)   BMI 28.29 kg/m   Physical Exam Constitutional:      Appearance: Normal appearance.  Pulmonary:     Effort: Pulmonary effort is normal.  Skin:    General: Skin is warm and dry.  Neurological:     General: No  focal deficit present.     Mental Status: She is alert.    Ortho Exam Examination of her lumbar spine no redness no erythema.  She has good flexion and extension.  Her sensation is intact.  She has good strength with dorsiflexion plantarflexion extension and flexion of her legs.  She does have a positive straight leg raise which reproduces the pain in her left posterior buttock.  No pain with hip range of motion Specialty Comments:  No specialty comments available.  Imaging: XR Lumbar Spine 2-3 Views  Result Date: 04/11/2023 2 view radiographs of her lumbar size spine were examined today.  Well preserved joint spacing.  No significant listhesis no evidence of any fracture similar to previous x-rays taken in 2021    PMFS History: Patient Active Problem List   Diagnosis Date Noted   Low back pain 04/11/2023   Aftercare 06/08/2018   Tear of triangular fibrocartilage 05/24/2018   Past Medical History:  Diagnosis Date   Asthma     Ganglion of right wrist    Right wrist pain    Wears glasses     History reviewed. No pertinent family history.  Past Surgical History:  Procedure Laterality Date   CESAREAN SECTION  2012;  10-08-2009 ;  01-07-2005   WRIST ARTHROSCOPY Right 05/29/2015   Procedure: RIGHT WRIST ARTHROSCOPY DEBRIDEMENT, ARTHROSCOPIC GANGLION CYST DECOMPRESSION;  Surgeon: Bradly Bienenstock, MD;  Location: [facility];  Service: Orthopedics;  Laterality: Right;   Social History   Occupational History   Not on file  Tobacco Use   Smoking status: Former    Years: 6    Types: Cigarettes    Quit date: 05/22/2013    Years since quitting: 9.8   Smokeless tobacco: Never  Vaping Use   Vaping Use: Never used  Substance and Sexual Activity   Alcohol use: No   Drug use: No   Sexual activity: Not on file

## 2023-05-23 ENCOUNTER — Ambulatory Visit: Payer: Medicaid Other | Admitting: Podiatry

## 2024-02-04 ENCOUNTER — Encounter (HOSPITAL_COMMUNITY): Payer: Self-pay

## 2024-02-04 ENCOUNTER — Ambulatory Visit (HOSPITAL_COMMUNITY)
Admission: RE | Admit: 2024-02-04 | Discharge: 2024-02-04 | Disposition: A | Discharge status: Home / Self Care | Source: Ambulatory Visit | Attending: Physician Assistant | Admitting: Physician Assistant

## 2024-02-04 ENCOUNTER — Other Ambulatory Visit: Payer: Self-pay

## 2024-02-04 VITALS — BP 116/74 | HR 95 | Temp 98.0°F | Resp 16

## 2024-02-04 DIAGNOSIS — M25531 Pain in right wrist: Secondary | ICD-10-CM | POA: Insufficient documentation

## 2024-02-04 DIAGNOSIS — Z113 Encounter for screening for infections with a predominantly sexual mode of transmission: Secondary | ICD-10-CM | POA: Insufficient documentation

## 2024-02-04 DIAGNOSIS — M67431 Ganglion, right wrist: Secondary | ICD-10-CM | POA: Insufficient documentation

## 2024-02-04 DIAGNOSIS — N898 Other specified noninflammatory disorders of vagina: Secondary | ICD-10-CM | POA: Diagnosis not present

## 2024-02-04 MED ORDER — METRONIDAZOLE 500 MG PO TABS: 500.0000 mg | ORAL_TABLET | Freq: Two times a day (BID) | ORAL | 0 refills | Status: AC

## 2024-02-04 NOTE — Discharge Instructions (Signed)
 Take metronidazole  twice daily for 7 days.  Do not drink any alcohol while on this medication for 3 days after you finish the course.  Wear loosefitting cotton underwear and use hypoallergenic soaps and detergents.  We will contact you if we need to arrange any additional treatment.  Monitor your MyChart for results.  If anything worsens and you have abdominal pain, pelvic pain, fever, nausea, vomiting you need to be seen immediately.  Follow-up with orthopedic provider about your ganglion cyst.  I hope the Ace wrap makes it feel better while you are at work.

## 2024-02-04 NOTE — ED Provider Notes (Signed)
 [facility]    CSN: [id number] Arrival date & time: 02/04/24  1007      History   Chief Complaint Chief Complaint  Patient presents with   Vaginal Itching    HPI Alice Wagner is a 43 y.o. female.   Patient presents today with several concerns.  Her primary concern today is a 2-day history of vaginal irritation with increased discharge.  She is concerned for bacterial vaginosis as she has a history of this condition with similar presentation.  She is sexually active but reports consistently using condoms.  She denies any change to the brand of a condom but has been using a new soap and thinks that this triggered her symptoms.  She is interested in STI testing but declined HIV and syphilis testing.  She denies any recent antibiotic use or medication changes.  She does not have a history of diabetes and does not take an SGLT2 inhibitor.  She is no concern for pregnancy.  Denies any pelvic pain, abdominal pain, fever, nausea, vomiting.  In addition, she reports increasing pain from her right wrist.  She has a ganglion cyst in this area and has undergone an MRI with anticipation of surgical removal in the future.  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  She has tried multiple braces at the recommendation of her orthopedist but unfortunately these restrict her movement.  She is requesting an Ace wrap today to see if this would provide some comfort and support.  She denies any numbness or paresthesias.    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/2018    Past Surgical History:  Procedure Laterality Date   CESAREAN SECTION  2012;  10-08-2009 ;  01-07-2005   WRIST ARTHROSCOPY Right 05/29/2015    Procedure: RIGHT WRIST ARTHROSCOPY DEBRIDEMENT, ARTHROSCOPIC GANGLION CYST DECOMPRESSION;  Surgeon: Arvil Birks, MD;  Location: [facility];  Service: Orthopedics;  Laterality: Right;    OB History     Gravida  4   Para  2   Term  2   Preterm      AB  2   Living  2      SAB  2   IAB      Ectopic      Multiple      Live Births  2            Home Medications    Prior to Admission medications   Medication Sig Start Date End Date Taking? Authorizing Provider  ergocalciferol (VITAMIN D2) 1.25 MG (50000 UT) capsule ergocalciferol (vitamin D2) 1,250 mcg (50,000 unit) capsule  TAKE 1 CAPSULE BY MOUTH ONCE WEEKLY FOR 8 WEEKS FOR 30 DAYS    [provider]  loratadine (CLARITIN) 10 MG tablet Take 10 mg by mouth daily. 05/21/20   [provider]  metroNIDAZOLE  (FLAGYL ) 500 MG tablet Take 1 tablet (500 mg total) by mouth 2 (two) times daily. 02/04/24   Mary-Ann Pennella K, PA-C  VENTOLIN  HFA 108 (90 Base) MCG/ACT inhaler Inhale 1 puff into the lungs every 6 (six) hours as needed for wheezing or shortness of breath. 08/30/22  Rising, Rebecca, PA-C  escitalopram (LEXAPRO) 10 MG tablet Take 10 mg by mouth daily. 05/21/20 10/25/20  [provider]    Family History History reviewed. No pertinent family history.  Social History Social History   Tobacco Use   Smoking status: Former    Current packs/day: 0.00    Types: Cigarettes    Start date: 05/23/2007    Quit date: 05/22/2013    Years since quitting: 10.7   Smokeless tobacco: Never  Vaping Use   Vaping status: Never Used  Substance Use Topics   Alcohol use: No   Drug use: No     Allergies   Aloe, Aspirin, and No known allergies   Review of Systems Review of Systems  Constitutional:  Negative for activity change, appetite change, fatigue and fever.  Gastrointestinal:  Negative for abdominal pain, diarrhea, nausea and vomiting.  Genitourinary:  Positive for vaginal discharge and  vaginal pain (irritation). Negative for dysuria, frequency, pelvic pain and vaginal bleeding.  Musculoskeletal:  Positive for arthralgias (right wrist). Negative for myalgias.  Neurological:  Negative for weakness and numbness.     Physical Exam Triage Vital Signs ED Triage Vitals [02/04/24 1020]  Encounter Vitals Group     BP 116/74     Systolic BP Percentile      Diastolic BP Percentile      Pulse Rate 95     Resp 16     Temp 98 F (36.7 C)     Temp Source Oral     SpO2 98 %     Weight      Height      Head Circumference      Peak Flow      Pain Score 0     Pain Loc      Pain Education      Exclude from Growth Chart    No data found.  Updated Vital Signs BP 116/74 (BP Location: Right Arm)   Pulse 95   Temp 98 F (36.7 C) (Oral)   Resp 16   LMP 01/12/2024 (Approximate)   SpO2 98%   Visual Acuity Right Eye Distance:   Left Eye Distance:   Bilateral Distance:    Right Eye Near:   Left Eye Near:    Bilateral Near:     Physical Exam Vitals reviewed.  Constitutional:      General: She is awake. She is not in acute distress.    Appearance: Normal appearance. She is well-developed. She is not ill-appearing.     Comments: Very pleasant female appears stated age in no acute distress sitting comfortably in exam room  HENT:     Head: Normocephalic and atraumatic.  Cardiovascular:     Rate and Rhythm: Normal rate and regular rhythm.     Heart sounds: Normal heart sounds, S1 normal and S2 normal. No murmur heard. Pulmonary:     Effort: Pulmonary effort is normal.     Breath sounds: Normal breath sounds. No wheezing, rhonchi or rales.     Comments: Clear to auscultation bilaterally Abdominal:     General: Bowel sounds are normal.     Palpations: Abdomen is soft.     Tenderness: There is no abdominal tenderness. There is no right CVA tenderness, left CVA tenderness, guarding or rebound.     Comments: Benign abdominal exam  Musculoskeletal:     Right wrist:  Tenderness present. No swelling or snuff box tenderness. Decreased range of motion.     Comments: Right wrist:  Approximately 2 cm x 1 cm ganglion cyst noted radial volar wrist that is mildly tender to palpation.  Normal active range of motion of the wrist.  Hand is neurovascularly intact.  No focal bony tenderness on exam.  Psychiatric:        Behavior: Behavior is cooperative.      UC Treatments / Results  Labs (all labs ordered are listed, but only abnormal results are displayed) Labs Reviewed  CERVICOVAGINAL ANCILLARY ONLY    EKG   Radiology No results found.  Procedures Procedures (including critical care time)  Medications Ordered in UC Medications - No data to display  Initial Impression / Assessment and Plan / UC Course  I have reviewed the triage vital signs and the nursing notes.  Pertinent labs & imaging results that were available during my care of the patient were reviewed by me and considered in my medical decision making (see chart for details).     Patient is well-appearing, afebrile, nontoxic, nontachycardic.  Vital signs and physical exam are reassuring with no indication for emergent evaluation or imaging.  Concern for bacterial vaginosis given her clinical presentation.  Will empirically treat with metronidazole  twice daily for 7 days.  We discussed that she is not to drink any alcohol while on this medication or for 3 days after completing course as this will cause associated vomiting.  She is to wear loosefitting cotton underwear and use hypoallergenic soaps and detergents.  STI swab was collected and we will contact her if we need to arrange additional treatment.  She declined HIV and syphilis testing.  We discussed that if she develops any worsening symptoms including pelvic pain, abdominal pain, fever, nausea, vomiting she needs to be seen immediately.  Strict return precautions given.  We discussed that symptoms are likely related to ganglion cyst.  She  was placed in Ace bandage in the hopes that this will provide some comfort and support but also allow some mobility for her to perform her job duties.  She can continue over-the-counter analgesics.  Recommend close follow-up with orthopedics.  Discussed that if anything worsen she needs to be seen immediately.  Final Clinical Impressions(s) / UC Diagnoses   Final diagnoses:  Vaginal irritation  Screening examination for STI  Right wrist pain  Ganglion cyst of wrist, right     Discharge Instructions      Take metronidazole  twice daily for 7 days.  Do not drink any alcohol while on this medication for 3 days after you finish the course.  Wear loosefitting cotton underwear and use hypoallergenic soaps and detergents.  We will contact you if we need to arrange any additional treatment.  Monitor your MyChart for results.  If anything worsens and you have abdominal pain, pelvic pain, fever, nausea, vomiting you need to be seen immediately.  Follow-up with orthopedic provider about your ganglion cyst.  I hope the Ace wrap makes it feel better while you are at work.     ED Prescriptions     Medication Sig Dispense Auth. Provider   metroNIDAZOLE  (FLAGYL ) 500 MG tablet Take 1 tablet (500 mg total) by mouth 2 (two) times daily. 14 tablet Shirline Kendle K, PA-C      PDMP not reviewed this encounter.   Budd Cargo, PA-C 02/04/24 1105

## 2024-02-04 NOTE — ED Triage Notes (Signed)
 Pt reports having itchiness on the outside of her vagina for the past couple days.

## 2024-02-06 LAB — CERVICOVAGINAL ANCILLARY ONLY
Bacterial Vaginitis (gardnerella): POSITIVE — AB
Candida Glabrata: NEGATIVE
Candida Vaginitis: NEGATIVE
Chlamydia: NEGATIVE
Comment: NEGATIVE
Comment: NEGATIVE
Comment: NEGATIVE
Comment: NEGATIVE
Comment: NEGATIVE
Comment: NORMAL
Neisseria Gonorrhea: NEGATIVE
Trichomonas: NEGATIVE

## 2024-04-11 ENCOUNTER — Other Ambulatory Visit: Payer: Self-pay

## 2024-04-12 LAB — SURGICAL PATHOLOGY
